# Patient Record
Sex: Female | Born: 2000 | Race: White | Hispanic: No | Marital: Single
Health system: Northeastern US, Academic
[De-identification: ages and names within clinical notes are randomized; demographics above are authoritative.]

## PROBLEM LIST (undated history)

## (undated) DIAGNOSIS — Z87820 Personal history of traumatic brain injury: Secondary | ICD-10-CM

## (undated) DIAGNOSIS — K219 Gastro-esophageal reflux disease without esophagitis: Secondary | ICD-10-CM

## (undated) HISTORY — PX: KNEE ARTHROSCOPY: SHX127

## (undated) HISTORY — DX: Gastro-esophageal reflux disease without esophagitis: K21.9

## (undated) HISTORY — DX: Personal history of traumatic brain injury: Z87.820

---
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
## 2019-01-03 ENCOUNTER — Ambulatory Visit

## 2019-01-21 ENCOUNTER — Ambulatory Visit: Admit: 2019-01-21

## 2019-01-21 ENCOUNTER — Ambulatory Visit: Admitting: Pediatrics

## 2019-01-21 MED ORDER — Apri: Tablet | Freq: Every day | 2 refills | 0 days | Status: AC

## 2019-01-21 MED ORDER — ProAir HFA: 108 | 2 | 0 refills | 0 days | Status: AC

## 2019-01-21 NOTE — Progress Notes (Signed)
 MARCELENE, WEIDEMANN **DOB:** 10-12-2000 (17 yo F) **Acc No.** [id number] **DOS:**  01/21/2019    ---      **Progress Note**    ---    **Patient:** Alison Hodges, Alison Hodges     **Account Number:** [id number] **External MRN:** [id number]  **Provider:** Jeani Hawking, NP     **DOB:** April 28, 2001 **Age:** 18 Y **Sex:** Female  **Date:** 01/21/2019     **Phone:** [phone]  **CHN#:** [id number]     **Address:** [address] [place], [place]     **Pcp:** Stormy Card, MD        * * *        **Subjective:**        ---      **Chief Complaints:**    ------        * 17 YEAR PE    ------    ---      **HPI:**    _Pre-Visit Prep QPC:_ :    2nd private trumenba, 1st hpv, ht, vt, trans hgb, gc    BC (private) not vfc eligible.    _6-11 Years_ :    Concerns: none.    Interim History No significant history.    Development/School graduated, going to [employer], Occupational hygienist major for  journalism, Doing well in school, + friends.    Nutrition Drinks 1% milk, good eater, usually well balanced.    Output No problems voiding/stooling.    Sleep No problems.    Environment No smokers in home, Smoke detector in home, no TB risk, Carbon  Monoxide detector in home, No Firearms in home.    Dental Dental Care in past 6 months, 28 teeth.    Friends several friends.    LMP 1 wk ago    not SA but knows SS    denies substance use.    _Questions for Child_ :    Has had sex education/discussion: yes.    Body development discussion, yet? yes.    Dating/Sex/Sexuality questions? no, discussed.    Do you ever feel down or depressed? no.    Regular exercise yes.    Have you begun having sex? no.    Do you smoke cigarettes, drink alcohol, or use other drugs? No.    _GYN_ :    LMP 1 week ago.    Menses Duration < 7 Days.    Menstrual Frequency 24-30 Days.    Pt does not need GYN exam:    -no hx persistant vag. dc    -not SA + dysuria/urinary tract sx    -no dysmenorrhea that is non responsive to NSAIDs    -no amenorrhea    -no abnormal vag. bleeding    -no lower  abdominal pain     -not going to use diaphram, IUD or implant    -no need for PAP <49 years old; no hx immunosuppression +SA; not SA x 51yrs.    -no concerns re abuse    -not pregnant    NO Contraindications:    No Hx of:    -thromboembolic disorder    -coronary or cerebrovascular disease    -uncontrolled hypertension    -markedly impaired liver fx or hx of hepatoma    -no known or suspected breast ca    -triglycerides <350    -no known or suspected pregnancy    -does not smoke cigarettes    -no migraine w/ aura.    ------      **ROS:**    _PED Behavioral/Developmental_ :       *  General Behavioral no concerns.     ------    _PED HEENT/Resp_ :      * Allergies/ Reactions No.     ------        * Dental: Yes.     ------    _PED GI/Nutrition_ :      * Abdominal pain no.     ------        * Constipation No.     ------        * Diarrhea No.     ------    _PED Musculoskeletal/Neuro_ :      * Musculoskeletal pain No.     ------    _ADO OBGYN History_ :      * 1st day of LMP: 1 week ago.     ------        * Cycle: regular.     ------        * Flow: varies.     ------        * Duration: 4-7.     ------        * Dysmenorrhea? no.     ------    _ADO Anticipatory Guidance_ :      * Self-exam: Self breast exam, Taught.     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 very mild intermittent. not recquirg Albuterol , 01/2016    ------        * R radial buckle Fx--12/26/05, RESOLVED        * vasovagal reaction with pain - syncope, urinary incontinence - 12/08, refer to Dr. Durward Mallard 12/10/09, RESOLVED        * went to [facility] for syncopy, 06/16/10. EKG, Lt axis deviation. Needs F up with cardiology per ED MD. DW Cardiology MD at [facility] by ED Md. 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 still ongoing lt knee discomfort, Re refer to ORTHO , 01/2016, on going 02/2017        * Father declined HPV vaccine 01/2016, 02/2017. declined meningitis vaccine 02/2017        * refer to counselling for Anxiety 02/2017        * Vasovagal episode        * Vasovagal episode         ------      **Surgical History:**    ------        * none     ------    ---     **Hospitalization/Major Diagnostic Procedure:**    ------        * none     ------    ---     **Family History:**    ------     Mother: alive, healthy    ------     Father: alive, healthy    ------    ---      **Social History:**    ------     Lives w/ parents.    ------    ---      **Medications:**    ------    Not-Taking/PRN     * Claritin 10 mg tablet 1 tab(s) orally  once a day    ------        * Flonase 50 mcg/inh spray 1 spray(s) intranasally once a day, Notes: *please review for potential _update for e-prescription and drug interaction check*    ------        * ProAir HFA 90 mcg/inh aerosol 2 puff(s) inhaled 4 times a day, Notes: *please review for potential _update for e-prescription and drug interaction check*    ------        * Vitamin B2 25 mg tablet 1 tab(s) orally once a day, Notes: *please review for potential _update for e-prescription and drug interaction check*    ------    Discontinued     * Albuterol 2.5 mg/3 mL (0.083%) solution 1 vial inhaled 4-6hrs prn cough/wheeze, Notes: *please review for potential _update for e-prescription and drug interaction check*    ------        * Medication List reviewed and reconciled with the patient    ------    ---      **Allergies:**    ------        * N.K.D.A.    ------    ---         **Objective:**        ---      **Vitals:** Ht-in 62.75, Wt-lbs 120.5, BMI 21.51, BP 110/70, Temp 97.6 ta,  BSA 1.55, Wt-kg 54.66, Wt %ile 39.75, Wt Change 12 lb, BMI Percentile 50.64.    ------      **Examination:    ** _Pediatric Exam:_      * GENERAL APPEARANCE:   well nourished, alert,active.     ------        * SKIN:  normal.     ------        * EYES: red reflex +, PERRLA, fundi normal, EOMI.     ------        * EARS: bilateral TM normal color, canals normal.     ------        * NOSE: nares patent and clear, mucosa normal.     ------        * ORAL CAVITY: moist mucus membranes, no lesions.     ------        * NECK: supple,no lymphadenopathy,thyroid normal.     ------        * HEART: regular rhythm, no murmurs.     ------        * LUNGS:  clear, equal breath sounds bilaterally.     ------        * ABDOMEN:  soft,nontender, no masses, normal bowel sounds.     ------        * GENITALIA: normal external genitalia, Tanner stage 5, normal breasts.     ------        * EXTREMITIES/BACK: good range of motion, no scoliosis.     ------        * NEUROLOGIC EXAM:  normal tone and motor development, normal sensory system and reflexes, normal cranial nerves II-XII.     ------        * DEVELOPMENTAL SCREEN PSC-Y - no concerns.     ------            **Assessment:**        ---      **Assessment:**          * Encounter for general adult medical examination without abnormal findings - Z00.00 (Primary)     ------        *  Encounter for immunization - Z23     ------        * Encounter for screening for infections with a predominantly sexual mode of transmission - Z11.3     ------        * Oral contraception initial prescription - Z30.011     ------        * Mild intermittent asthma without complication - J45.20     ------        **Plan:**        ---       **1\. Encounter for general adult medical examination without abnormal  findings**       * **Lab:** HEMOGLOBIN  14.2 g/dl   Value Reference Range    ---------    HEMOGLOBIN 14.2 g/dl     *  **Lab:** HCG, QL, URINE  negative  Gillis,Kathleen 01/21/2019 3:00:50 PM >IQC SEEN    ------     **2\. Encounter for immunization**       * **Lab:** CHLAMYDIA/N. GONORRHOEAE RNA, TMA    ------        *  **Procedure:** AUDIOMETRY-SCREEN  passed 25 all frequencies both ears   Nelson,Naomi , RN 01/21/2019 3:03:58 PM  >    ------    *  **Procedure:** VISUAL ACUITY SCREEN  20/20 far and near, left and right eyes  Nelson,Naomi , RN 01/21/2019 3:04:47  PM >    ------     **3\. Oral contraception initial prescription**    Start Apri Tablet, 0.15-30 MG-MCG, 1 tablet, Orally, Once a day, 84 day(s), 84  Tablet, Refills 2 ;    Start ProAir HFA Aerosol Solution, 108 (90 Base) MCG/ACT, 2 puff as needed,  inhalation, every 4-6 hrs PRN, 2, Refills 0 .    Notes: Notes: advised on nutrition and good eating habits.    routine well care. anticipatory guidance as below.    counseling: break through bleeding is common in the first few months, should  resolve w/ consistant use. If nauseous, take OCP w/ food or before bed. Take  at approximately the same time daily. Weight gain is NOT a common side effect.  **f/u w/ s/s thromboembolic disease---severe pain in leg or abdomen; cough,  chest pain, shortness of breath.    information/instructions on oral contraceptives provided, pt reviewed & signed  consent form. 25 min spent w/ patient, >50% counseling.    **4\. Mild intermittent asthma without complication**    Notes: rare albuterol use, f/u prn s/s.     **Immunizations:**       * HPV Vaccine : 0.5 (Route: Intramuscular) given by Bonna Gains on Right Arm    ------        * Meningococcal Ferne Reus) (Route: Intramuscular) given by Wynona Dove on Left Arm    ------      **Labs:**    ------         * **Lab:**  HCG    ---      **Procedure Codes:**    ------        * [address] BRIEF EMOTIONAL/BEHAV ASSMT, Modifiers: U1     ------        * 60454 URINE PREGNANCY TEST    ------        * 90460 IMADM ANY ROUTE 1ST VAC/TOX, Units: 2.00     ------        * 09811 HPV Vaccine    ------        * 91478 MENB RP  W/OMV VACCINE IM    ------        * 92551 AUDIOMETRY-SCREEN    ------        * 19379 VISUAL ACUITY SCREEN    ------     ---      **Preventive:**    Teen:    Alcohol .    Breast Exam .    Development .    Drugs .    Eating/Body Image .    Emotions .    Exercise .    Hygiene .    Injury Prevention .    Nutrition/Weight .    Physical Activity .    Safe Sex .    Seat Belt use .    STD .    Tobacco Avoidance .    ------     **Follow Up:** well child in 1 yr. 3 Months    ------    ---    ---        ---    Electronically signed by Jeani Hawking on 01/21/2019 at 06:27 PM EDT    Sign off status: Completed          * * *      **Provider:** Jeani Hawking, NP  **Date:** 01/21/2019    ------

## 2019-01-21 NOTE — Progress Notes (Signed)
 .  Progress Notes  .  Patient: Alison Hodges  Provider: Jeani Hawking    .  DOB: 01-20-2001 Age: 18 Y Sex: Female  .  PCP: Stormy Card MD  Date: 01/21/2019  .  --------------------------------------------------------------------------------  .  REASON FOR APPOINTMENT  .  1. 17 YEAR PE  .  HISTORY OF PRESENT ILLNESS  .  Pre-Visit Prep QPC::  2nd private trumenba, 1st  hpv, ht, vt, trans hgb, gcBC (private) not vfc eligible.  Marland Kitchen  6-11 Years:  Concerns:  none.  .  Interim History  No significant history.  .  Development/School  graduated, going to [employer], Occupational hygienist major for  journalism, Doing well in school, + friends.  .  Nutrition  Drinks 1% milk, good eater, usually well balanced.  .  Output  No problems voiding/stooling.  .  Sleep  No problems.  .  Environment  No smokers in home, Smoke detector in home, no TB risk, Carbon  Monoxide detector in home, No Firearms in home.  .  Dental  Dental Care in past 6 months, 28 teeth.  .  Friends  several friends.  Marland Kitchen   LMP 1 wk agonot SA but knows SSdenies substance use.  .  Questions for Child:  Has had sex education/discussion:  yes.  .  Body development discussion, yet?  yes.  .  Dating/Sex/Sexuality questions?  no, discussed.  .  Do you ever feel down or depressed?  no.  .  Regular exercise  yes.  .  Have you begun having sex?  no.  .  Do you smoke cigarettes, drink alcohol, or use  other drugs?  No.  .  GYN:  LMP  1 week ago.  .  Menses Duration  < 7 Days.  .  Menstrual Frequency  24-30 Days.  .   Pt does not need GYN exam:-no hx persistant vag. dc-not SA +  dysuria/urinary tract sx-no dysmenorrhea that is non responsive  to NSAIDs-no amenorrhea-no abnormal vag. bleeding-no lower  abdominal pain -not going to use diaphram, IUD or implant-no need  for PAP <23 years old; no hx immunosuppression +SA; not SA x  6yrs.-no concerns re abuse-not pregnantNO Contraindications:No Hx  of:-thromboembolic disorder-coronary or cerebrovascular  disease-uncontrolled  hypertension-markedly impaired liver fx or  hx of hepatoma-no known or suspected breast ca-triglycerides  <350-no known or suspected pregnancy-does not smoke cigarettes-no  migraine w/ aura.  .  CURRENT MEDICATIONS  .  Not-Taking/PRN Claritin 10 mg tablet 1 tab(s) orally once a day  Not-Taking/PRN Flonase 50 mcg/inh spray 1 spray(s) intranasally  once a day, Notes: *please review for potential _update for  e-prescription and drug interaction check*  Not-Taking/PRN ProAir HFA 90 mcg/inh aerosol 2 puff(s) inhaled 4  times a day, Notes: *please review for potential _update for  e-prescription and drug interaction check*  Not-Taking/PRN Vitamin B2 25 mg tablet 1 tab(s) orally once a  day, Notes: *please review for potential _update for  e-prescription and drug interaction check*  Discontinued Albuterol 2.5 mg/3 mL (0.083%) solution 1 vial  inhaled 4-6hrs prn cough/wheeze, Notes: *please review for  potential _update for e-prescription and drug interaction check*  Medication List reviewed and reconciled with the patient  .  PAST MEDICAL HISTORY  .  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 01/30/11 no albuterol in >42yr.only EIA.  very mild intermittent. not recquirg Albuterol , 01/2016  R radial buckle Fx--12/26/05, RESOLVED  vasovagal reaction with pain - syncope, urinary incontinence -  12/08, refer to Dr. Durward Mallard 12/10/09,  RESOLVED  went to [facility] for [facility]copy, 06/16/10. EKG, Lt axis deviation. Needs  F up with cardiology per ED MD. DW Cardiology MD at [facility] by ED Md.  seen By cardiology, normal vaso vagal, No f up, Neurology  wnl.RESOLVED 01/2016  s shaped scoliosis - 10', RESOLVED 01/2016, 02/2017  Knee pain - 06/30/13 - osgood schlatter & mild chondromalacia.  still ongoing lt knee discomfort, Re refer to ORTHO , 01/2016, on  going 02/2017  Father declined HPV vaccine 01/2016, 02/2017. declined meningitis  vaccine 02/2017  refer to counselling for Anxiety 02/2017  Vasovagal episode  Vasovagal episode  .  ALLERGIES  .  N.K.D.A.  .  SURGICAL  HISTORY  .  none  .  FAMILY HISTORY  .  Mother: alive, healthy  Father: alive, healthy  .  SOCIAL HISTORY  .  Lives w/ parents.  .  HOSPITALIZATION/MAJOR DIAGNOSTIC PROCEDURE  .  none  .  REVIEW OF SYSTEMS  .  PED Behavioral/Developmental:  .  General Behavioral    no concerns .  Marland Kitchen  PED HEENT/Resp:  .  Allergies/ Reactions No. Dental: Yes.  Marland Kitchen  PED GI/Nutrition:  .  Abdominal pain    no . Constipation No. Diarrhea No.  .  PED Musculoskeletal/Neuro:  Marland Kitchen  Musculoskeletal pain No.  .  ADO OBGYN History:  .  1st day of LMP:    1 week ago . Cycle:    regular . Flow:     varies . Duration:    4-7 . Dysmenorrhea?    no .  .  ADO Anticipatory Guidance:  .  Self-exam:    Self breast exam, Taught .  Marland Kitchen  VITAL SIGNS  .  Ht-in 62.75, Wt-lbs 120.5, BMI 21.51, BP 110/70, Temp 97.6 ta,  BSA 1.55, Wt-kg 54.66, Wt %ile 39.75, Wt Change 12 lb, BMI  Percentile 50.64.  Marland Kitchen  EXAMINATION  .  Pediatric Exam:  GENERAL APPEARANCE: well nourished, alert,active.  Marland Kitchen  SKIN: normal.  .  EYES:red reflex +, PERRLA, fundi normal, EOMI.  Marland Kitchen  EARS:bilateral TM normal color, canals normal.  .  NOSE:nares patent and clear, mucosa normal.  .  ORAL CAVITY:moist mucus membranes, no lesions.  Marland Kitchen  NECK:supple,no lymphadenopathy,thyroid normal.  .  HEART:regular rhythm, no murmurs.  Marland Kitchen  LUNGS: clear, equal breath sounds bilaterally.  .  ABDOMEN: soft,nontender, no masses, normal bowel sounds.  .  GENITALIA:normal external genitalia, Tanner stage 5, normal  breasts.  Marland Kitchen  EXTREMITIES/BACK:good range of motion, no scoliosis.  Marland Kitchen  NEUROLOGIC EXAM: normal tone and motor development, normal  sensory system and reflexes, normal cranial nerves II-XII.  Marland Kitchen  DEVELOPMENTAL SCREENPSC-Y - no concerns.  .  ASSESSMENTS  .  Encounter for general adult medical examination without abnormal  findings - Z00.00 (Primary)  .  Encounter for immunization - Z23  .  Encounter for screening for infections with a predominantly  sexual mode of transmission - Z11.3  .  Oral contraception initial  prescription - Z30.011  .  Mild intermittent asthma without complication - J45.20  .  TREATMENT  .  Encounter for general adult medical examination  without abnormal findings  LAB: HEMOGLOBIN  14.2 g/dl HEMOGLOBIN     96.0 g/dl     ()  .  Marland Kitchen  LAB: HCG, QL, URINE  negative    Gillis,Kathleen 01/21/2019 3:00:50 PM >IQC SEEN  .  Marland Kitchen  Encounter for immunization  LAB: CHLAMYDIA/N. GONORRHOEAE RNA, TMA  .  AUDIOMETRY-SCREENpassed 25 all  frequencies both earsNelson,Naomi  , RN 01/21/2019 3:03:58 PM >  VISUAL ACUITY SCREEN20/20 far and near, left and right  eyesNelson,Naomi , RN 01/21/2019 3:04:47 PM >  .  .  Oral contraception initial prescription  Start Apri Tablet, 0.15-30 MG-MCG, 1 tablet, Orally, Once a day,  84 day(s), 84 Tablet, Refills 2  Start ProAir HFA Aerosol Solution, 108 (90 Base) MCG/ACT, 2 puff  as needed, inhalation, every 4-6 hrs PRN, 2, Refills 0  Notes: Notes: advised on nutrition and good eating habits.  routine well care. anticipatory guidance as below.  .  counseling: break through bleeding is common in the first few  months, should resolve w/ consistant use. If nauseous, take OCP  w/ food or before bed. Take at approximately the same time daily.  Weight gain is NOT a common side effect. **f/u w/ s/s  thromboembolic disease---severe pain in leg or abdomen; cough,  chest pain, shortness of breath.  .  information/instructions on oral contraceptives provided, pt  reviewed & signed consent form. 25 min spent w/ patient, >50%  counseling.  .  .  Mild intermittent asthma without complication  Notes: rare albuterol use, f/u prn s/s.  Marland Kitchen  LABS  .   LAB: HCG  .  Marland Kitchen  PREVENTIVE MEDICINE  .  Teen:  .  Alcohol   .  Breast Exam   .  Development   .  Drugs   .  Eating/Body Image   .  Emotions   .  Exercise   .  Hygiene   .  Injury Prevention   .  Nutrition/Weight   .  Physical Activity   .  Safe Sex   .  Seat Belt use   .  STD   .  Tobacco Avoidance   .  Marland Kitchen  PROCEDURE CODES  .  29562 BRIEF EMOTIONAL/BEHAV ASSMT, Modifiers:  U1  .  [id number] URINE PREGNANCY TEST  .  90460 IMADM ANY ROUTE 1ST VAC/TOX, Units: 2.00  .  13086 HPV Vaccine  .  57846 MENB RP W/OMV VACCINE IM  .  92551 AUDIOMETRY-SCREEN  .  96295 VISUAL ACUITY SCREEN  .  FOLLOW UP  .  well child in 1 yr. 3 Months  .  Electronically signed by Jeani Hawking on 01/21/2019  at 06:27 PM EDT  .  Document electronically signed by Jeani Hawking    .

## 2019-01-23 LAB — HX CHLAMYDIA/N. GONORRHOEAE RNA, TMA
HX CHLAMYDIA TRACHOMATIS: NOT DETECTED
HX NEISSERIA GONORRHOEAE: NOT DETECTED

## 2019-03-28 DIAGNOSIS — S90929A Unspecified superficial injury of unspecified foot, initial encounter: Secondary | ICD-10-CM | POA: Insufficient documentation

## 2019-06-11 ENCOUNTER — Ambulatory Visit: Admit: 2019-06-11

## 2019-06-11 ENCOUNTER — Ambulatory Visit: Admitting: Pediatrics

## 2019-06-11 NOTE — Progress Notes (Signed)
.    Progress Notes  .  Patient: Alison Hodges  Provider: Darrall Dears    .  DOB: [birth year] Age: 18 Y Sex: Female  .  PCP: Stormy Card MD  Date: 06/11/2019  .  --------------------------------------------------------------------------------  .  REASON FOR APPOINTMENT  .  1. FLU SHOT  .  HISTORY OF PRESENT ILLNESS  .  Flu Shot:  Informant: Patient with Mother, denies  exposure, symptoms or recent testing for Covid. Is requesting 2nd  HPV vaccine today. kgma.  Flu Shot  Patient is here for flu vaccine. No current illness or high  fever.  .  CURRENT MEDICATIONS  .  Taking Apri 0.15-30 MG-MCG Tablet 1 tablet Orally Once a day  Taking ProAir HFA 108 (90 Base) MCG/ACT Aerosol Solution 2 puff  as needed inhalation every 4-6 hrs PRN  Not-Taking/PRN Claritin 10 mg tablet 1 tab(s) orally once a day  Not-Taking/PRN Flonase 50 mcg/inh spray 1 spray(s) intranasally  once a day, Notes: *please review for potential _update for  e-prescription and drug interaction check*  Not-Taking/PRN ProAir HFA 90 mcg/inh aerosol 2 puff(s) inhaled 4  times a day, Notes: *please review for potential _update for  e-prescription and drug interaction check*  Not-Taking/PRN Vitamin B2 25 mg tablet 1 tab(s) orally once a  day, Notes: *please review for potential _update for  e-prescription and drug interaction check*  .  ALLERGIES  .  yes[Allergies Verified]  .  REVIEW OF SYSTEMS  .  PED HEENT/Resp:  .  Respiratory:    denies congestion .  Marland Kitchen  ASSESSMENTS  .  Encounter for immunization - Z23 (Primary)  .  TREATMENT  .  Encounter for immunization  Notes: Immunization given, VIS given. No immediate adverse  reaction.  Marland Kitchen  PROCEDURE CODES  .  84696 9vhpv Vacc 2/3 Dose Sched Im Use  .  90686 FLU VAC NO PRSV 4 VAL 3 YRS+  .  FOLLOW UP  .  prn  .  Electronically signed by Darrall Dears on  06/12/2019 at 03:17 PM EST  .  Document electronically signed by Darrall Dears    .

## 2019-06-11 NOTE — Progress Notes (Signed)
 ZANI, KYLLONEN **DOB:** 2000/11/28 (18 yo F) **Acc No.** [id number] **DOS:**  06/11/2019    ---      **Progress Notes**    ---    **Patient:** Alison Hodges, Alison Hodges     **Account Number:** [id number] **External MRN:** [id number]  **Provider:** Darrall Dears, NP     **DOB:** [birth year] **Age:** 18 Y **Sex:** Female  **Date:** 06/11/2019     **Phone:** [phone]  **CHN#:** [id number]     **Address:** [address] [place], [place]     **Pcp:** Stormy Card, MD        * * *        **Subjective:**        ---      **Chief Complaints:**    ------        * FLU SHOT    ------    ---      **HPI:**    _Flu Shot_ :    Informant: Patient with Mother, denies exposure, symptoms or recent testing  for Covid. Is requesting 2nd HPV vaccine today. kgma.    Flu Shot Patient is here for flu vaccine. No current illness or high fever.    ------     **ROS:**    _PED HEENT/Resp_ :       * Respiratory: denies congestion.     ------     **Medications:**    ------    Taking     * Apri 0.15-30 MG-MCG Tablet 1 tablet Orally Once a day    ------        * ProAir HFA 108 (90 Base) MCG/ACT Aerosol Solution 2 puff as needed inhalation every 4-6 hrs PRN    ------    Not-Taking/PRN     * Claritin 10 mg tablet 1 tab(s) orally once a day    ------        * Flonase 50 mcg/inh spray 1 spray(s) intranasally once a day, Notes: *please review for potential _update for e-prescription and drug interaction check*    ------        * ProAir HFA 90 mcg/inh aerosol 2 puff(s) inhaled 4 times a day, Notes: *please review for potential _update for e-prescription and drug interaction check*    ------        * Vitamin B2 25 mg tablet 1 tab(s) orally once a day, Notes: *please review for potential _update for e-prescription and drug interaction check*    ------    ---      **Allergies:**    ------    ---        **Objective:**        ---         **Assessment:**        ---      **Assessment:**          * Encounter for immunization - Z23 (Primary)      ------        **Plan:**        ---       **1\. Encounter for immunization**    Notes:  Immunization given, VIS given. No immediate adverse reaction.    ---     **Immunizations:**       * Flu Vaccine : 0.5 mL (Route: Intradermal) given by Sande Rives on Left Arm (Encounter for immunization)    ------        * HPV Vaccine (HPV9) : 0.5 mL (Route: Intramuscular) given by Sande Rives on Right Arm (Encounter for immunization)    ------      **  Procedure Codes:**    ------        * 99242 9vhpv Vacc 2/3 Dose Sched Im Use    ------        * 68341 FLU VAC NO PRSV 4 VAL 3 YRS+    ------    ---      **Follow Up:** prn    ------    ---    ---        ---    Electronically signed by Darrall Dears on 06/12/2019 at 03:17 PM EST    Sign off status: Completed          * * *      **Provider:** Darrall Dears, NP  **Date:** 06/11/2019    ------

## 2019-06-13 ENCOUNTER — Ambulatory Visit: Admit: 2019-06-13

## 2019-06-13 ENCOUNTER — Ambulatory Visit: Admitting: Pediatrics

## 2019-06-13 MED ORDER — Aviane: 28 | Freq: Every day | 5 refills | 0 days | Status: AC

## 2019-06-13 NOTE — Progress Notes (Signed)
 Alison Hodges, Alison Hodges **DOB:** 20-Jul-2000 (18 yo F) **Acc No.** [id number] **DOS:**  06/13/2019    ---      **Progress Notes**    ---    **Patient:** Alison Hodges, Alison Hodges     **Account Number:** [id number] **External MRN:** [id number]  **Provider:** Darrall Dears, NP     **DOB:** [birth year] **Age:** 18 Y **Sex:** Female  **Date:** 06/13/2019     **Phone:** [phone]  **CHN#:** [id number]     **Address:** [address] [place], [place]     **Pcp:** Stormy Card, MD        * * *        **Subjective:**        ---      **Chief Complaints:**    ------        * BIRTH CONTROL    ------    ---      **HPI:**    _GENERAL_ :    This is a TeleHealth visit.The patient and guardian understand that by  participating in this Telehealth visit they give consent to its use. The  Telehealth used included video and audio. The Telehealth system is being used  due to the current Covid-19 pandemic and the federally declared state of  public health emergency. The patient and guardian are at the above home  address. The provider (me) is at [address], [place], [place] [address].  Participant are: Olinda and myself.    HPI 18 year old female accompanied by self. presents for virtual telehealth  visit to discuss OCP. patient currently on Apri 0.15-30 MG-MCG. patient feels  she is gaining weight on current OCP and would like to discuss changing to  another OCP. patient denies other side effects/adverse reactions. denies BTB.  patient is not currently sexually active, reports began taking to be proactive  upon starting college. 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 very mild intermittent. not recquirg Albuterol , 01/2016    ------        * R radial buckle Fx--12/26/05, RESOLVED        * vasovagal reaction with pain - syncope, urinary incontinence - 12/08, refer to Dr. Durward Mallard 12/10/09, RESOLVED        * went to [facility] for [facility]copy, 06/16/10. EKG, Lt axis deviation. Needs F up  with cardiology per ED MD. DW Cardiology MD at [facility] by ED Md. 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 still ongoing lt knee discomfort, Re refer to ORTHO , 01/2016, on going 02/2017        * Father declined HPV vaccine 01/2016, 02/2017. declined meningitis vaccine 02/2017        * refer to counselling for Anxiety 02/2017        * Vasovagal episode        * Vasovagal episode         ------      **Medications:**    ------    Taking     * Apri 0.15-30 MG-MCG Tablet 1 tablet Orally Once a day    ------    Not-Taking/PRN     * Claritin 10 mg tablet 1 tab(s) orally  once a day    ------        * Flonase 50 mcg/inh spray 1 spray(s) intranasally once a day, Notes: *please review for potential _update for e-prescription and drug interaction check*    ------        * ProAir HFA 108 (90 Base) MCG/ACT Aerosol Solution 2 puff as needed inhalation every 4-6 hrs PRN    ------        * ProAir HFA 90 mcg/inh aerosol 2 puff(s) inhaled 4 times a day, Notes: *please review for potential _update for e-prescription and drug interaction check*    ------        * Vitamin B2 25 mg tablet 1 tab(s) orally once a day, Notes: *please review for potential _update for e-prescription and drug interaction check*    ------        * Medication List reviewed and reconciled with the patient    ------    ---      **Allergies:**    ------        * N.K.D.A.    ------    ---         **Objective:**        ---      **Examination:    ** _GENERAL:_      * General  Alert, appears well nourished and hydrated, in NAD.     ------        * Eyes  By telehealth camera, facies appears normal, eyes show no redness nor discharge and have normal extraocular movements.     ------        * Ears, Nose, Mouth and Throat By telehealth camera, nose appears not congested nor rhinorrhea,  pharynx appears pink without erythema or exudate, tonsils apparent, mucous membranes appear moist.     ------        * Neck  By telehealth camera, neck shows no obvious lymphadenopathy nor thyromegaly.     ------        * Respiratory  By telehealth camera, chest appears normal shape and expansion, no chest wall retractions visualized.     ------        * Abdomen  By telehealth camera, abdomen appears flat, non distended with no obvious rigidity.     ------        * Musculoskeletal / Neurological  By telehealth camera, patient with full, equal ROM of extremities; no obvious neurological impairments, patient responds normally to time and space.     ------        * Skin  By telehealth camera, skin appears healthy with no rashes.     ------            **Assessment:**        ---      **Assessment:**          * Dysmenorrhea - N94.6 (Primary)     ------        * Oral contraceptive intolerance - Z78.9     ------        * Long term current use of hormonal contraceptive - Z79.3     ------        **Plan:**        ---       **1\. Dysmenorrhea**    Stop Apri Tablet, 0.15-30 MG-MCG, 1 tablet, Orally, Once a day ;    Start Aviane Tablet, 0.1-20 MG-MCG, 1 tablet, Orally, Once a day, 28 day(s),  28, Refills  5 .    Notes: Will start patient on lower estrogen OCP Aviane 0.1-20 MG-MCG. Patient  may complete current pack of Apri and start Aviane at next cycle. Reviewed the  need the take at the same time every day as those with lower estrogen have  smaller window. Reinforced safe sex practices and 100% condom use for  protection against STDs/STIs and pregnancy. Monitor for side effects including  BTB, headaches, nausea (or other) and follow up as needed. All questions  answered. Patient understands and will monitor. Recheck in 6 months.    ---    **2\. Others**    Notes: Length of visit 12 minutes.     **Follow Up:** 6 months (Reason: GYFU/ocp check)    ------    ---    ---        ---    Electronically signed by  Darrall Dears on 06/28/2019 at 02:56 PM EST    Sign off status: Completed          * * *      **Provider:** Darrall Dears, NP  **Date:** 06/13/2019    ------

## 2019-06-13 NOTE — Progress Notes (Signed)
 .  Progress Notes  .  Patient: Alison Hodges  Provider: Darrall Dears    .  DOB: 2001-04-27 Age: 18 Y Sex: Female  .  PCP: Stormy Card MD  Date: 06/13/2019  .  --------------------------------------------------------------------------------  .  REASON FOR APPOINTMENT  .  1. BIRTH CONTROL  .  HISTORY OF PRESENT ILLNESS  .  GENERAL:  This is a TeleHealth visit.The patient  and guardian understand that by participating in this Telehealth  visit they give consent to its use. The Telehealth used included  video and audio. The Telehealth system is being used due to the  current Covid-19 pandemic and the federally declared state of  public health emergency. The patient and guardian are at the  above home address. The provider (me) is at [address],  [place], [place] [address]. Participant are: Dennis and myself.  HPI  18 year old female accompanied by self. presents for virtual  telehealth visit to discuss OCP. patient currently on Apri  0.15-30 MG-MCG. patient feels she is gaining weight on current  OCP and would like to discuss changing to another OCP. patient  denies other side effects/adverse reactions. denies BTB. patient  is not currently sexually active, reports began taking to be  proactive upon starting college. .  .  CURRENT MEDICATIONS  .  Taking Apri 0.15-30 MG-MCG Tablet 1 tablet Orally Once a day  Not-Taking/PRN Claritin 10 mg tablet 1 tab(s) orally once a day  Not-Taking/PRN Flonase 50 mcg/inh spray 1 spray(s) intranasally  once a day, Notes: *please review for potential _update for  e-prescription and drug interaction check*  Not-Taking/PRN ProAir HFA 108 (90 Base) MCG/ACT Aerosol Solution  2 puff as needed inhalation every 4-6 hrs PRN  Not-Taking/PRN ProAir HFA 90 mcg/inh aerosol 2 puff(s) inhaled 4  times a day, Notes: *please review for potential _update for  e-prescription and drug interaction check*  Not-Taking/PRN Vitamin B2 25 mg tablet 1 tab(s) orally once a  day, Notes: *please review for  potential _update for  e-prescription and drug interaction check*  Medication List reviewed and reconciled with the patient  .  PAST MEDICAL HISTORY  .  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 01/30/11 no albuterol in >40yr.only EIA.  very mild intermittent. not recquirg Albuterol , 01/2016  R radial buckle Fx--12/26/05, RESOLVED  vasovagal reaction with pain - syncope, urinary incontinence -  12/08, refer to Dr. Durward Mallard 12/10/09, RESOLVED  went to [facility] for [facility]copy, 06/16/10. EKG, Lt axis deviation. Needs  F up with cardiology per ED MD. DW Cardiology MD at [facility] by ED Md.  seen By cardiology, normal vaso vagal, No f up, Neurology  wnl.RESOLVED 01/2016  s shaped scoliosis - 10', RESOLVED 01/2016, 02/2017  Knee pain - 06/30/13 - osgood schlatter & mild chondromalacia.  still ongoing lt knee discomfort, Re refer to ORTHO , 01/2016, on  going 02/2017  Father declined HPV vaccine 01/2016, 02/2017. declined meningitis  vaccine 02/2017  refer to counselling for Anxiety 02/2017  Vasovagal episode  Vasovagal episode  .  ALLERGIES  .  N.K.D.A.  Marland Kitchen  EXAMINATION  .  GENERAL:  General Alert, appears well nourished and hydrated, in NAD.  Marland Kitchen  Eyes By telehealth camera, facies appears normal, eyes show no  redness nor discharge and have normal extraocular movements.  .  Ears, Nose, Mouth and ThroatBy telehealth camera, nose appears  not congested nor rhinorrhea, pharynx appears pink without  erythema or exudate, tonsils apparent, mucous membranes appear  moist.  .  Neck  By telehealth camera, neck shows no obvious lymphadenopathy  nor thyromegaly.  Marland Kitchen  Respiratory By telehealth camera, chest appears normal shape and  expansion, no chest wall retractions visualized.  .  Abdomen By telehealth camera, abdomen appears flat, non distended  with no obvious rigidity.  .  Musculoskeletal / Neurological By telehealth camera, patient with  full, equal ROM of extremities; no obvious neurological  impairments, patient responds normally to time and space.  .  Skin By telehealth  camera, skin appears healthy with no rashes.  .  ASSESSMENTS  .  Dysmenorrhea - N94.6 (Primary)  .  Oral contraceptive intolerance - Z78.9  .  Long term current use of hormonal contraceptive - Z79.3  .  TREATMENT  .  Dysmenorrhea  Stop Apri Tablet, 0.15-30 MG-MCG, 1 tablet, Orally, Once a day  Start Aviane Tablet, 0.1-20 MG-MCG, 1 tablet, Orally, Once a day,  28 day(s), 28, Refills 5  Notes: Will start patient on lower estrogen OCP Aviane 0.1-20  MG-MCG. Patient may complete current pack of Apri and start  Aviane at next cycle. Reviewed the need the take at the same time  every day as those with lower estrogen have smaller window.  Reinforced safe sex practices and 100% condom use for protection  against STDs/STIs and pregnancy. Monitor for side effects  including BTB, headaches, nausea (or other) and follow up as  needed. All questions answered. Patient understands and will  monitor. Recheck in 6 months.  .  .  Others  Notes: Length of visit 12 minutes.  .  FOLLOW UP  .  6 months (Reason: GYFU/ocp check)  .  Electronically signed by Darrall Dears on  06/28/2019 at 02:56 PM EST  .  Document electronically signed by Darrall Dears    .

## 2019-07-22 DIAGNOSIS — M25562 Pain in left knee: Secondary | ICD-10-CM | POA: Insufficient documentation

## 2019-08-29 ENCOUNTER — Other Ambulatory Visit: Payer: Self-pay | Admitting: Sports Medicine

## 2019-08-29 DIAGNOSIS — M25562 Pain in left knee: Secondary | ICD-10-CM

## 2019-08-29 DIAGNOSIS — M2392 Unspecified internal derangement of left knee: Secondary | ICD-10-CM

## 2019-09-09 ENCOUNTER — Other Ambulatory Visit: Payer: Self-pay

## 2019-09-09 ENCOUNTER — Ambulatory Visit
Admission: RE | Admit: 2019-09-09 | Discharge: 2019-09-09 | Disposition: A | Discharge status: Home / Self Care | Payer: BC Managed Care – PPO | Source: Ambulatory Visit | Attending: Sports Medicine | Admitting: Sports Medicine

## 2019-09-09 DIAGNOSIS — M25562 Pain in left knee: Secondary | ICD-10-CM | POA: Diagnosis present

## 2019-09-09 DIAGNOSIS — M2392 Unspecified internal derangement of left knee: Secondary | ICD-10-CM | POA: Insufficient documentation

## 2019-11-11 ENCOUNTER — Ambulatory Visit: Admitting: Pediatrics

## 2019-11-11 NOTE — Progress Notes (Signed)
* * *      HILDAGARD, SOBECKI **DOB:** Dec 21, 2000 (19 yo F) **Acc No.** [id number] **DOS:**  11/11/2019    ---       Page Spiro**    ------    42 Y old Female, DOB: November 25, 2000    [address] [place], [place] [address]    Home: [phone]    Provider: Darrall Dears        * * *    Telephone Encounter    ---    Answered by  Jonette Mate Date: 11/11/2019       Time: 02:55 PM    Message                     Pt called. She is leaving college in [place] this weekend and will need refill of BCP before she back home in [place]. She needs start BCP again sunday , Pt aware she need BCP recheck and another HPV vaccine. She scheduled appt for these.  Script for BCP for 1 month [facility]t to [facility] [place] te [phone], [address]          ------            Action Taken                     [place] , RN 11/11/2019 3:01:26 PM >                 Refills Refill Aviane Tablet, 0.1-20 MG-MCG, Orally, 28 Tablet, 1 tablet,  Once a day, 28 days, Refills=0    ------          * * *                ---          * * *         Provider: Darrall Dears 11/11/2019    ---    Note generated by eClinicalWorks EMR/PM Software ([email or web address])

## 2019-12-02 ENCOUNTER — Ambulatory Visit: Admit: 2019-12-02

## 2019-12-02 ENCOUNTER — Ambulatory Visit: Admitting: Student in an Organized Health Care Education/Training Program

## 2019-12-02 ENCOUNTER — Ambulatory Visit

## 2019-12-02 MED ORDER — Aviane: Tablet | Freq: Every day | 3 refills | 0 days | Status: AC

## 2019-12-02 NOTE — Progress Notes (Signed)
 Alison Hodges, Alison Hodges **DOB:** 11/30/2000 (19 yo F) **Acc No.** [id number] **DOS:**  12/02/2019    ---      **Progress Notes**    ---    **Patient:** Alison Hodges, Alison Hodges     **Account Number:** [id number] **External MRN:** [id number]  **Provider:** Carmelina Paddock, MD     **DOB:** Dec 02, 2000 **Age:** 19 Y **Sex:** Female  **Date:** 12/02/2019     **Phone:** [phone]  **CHN#:** [id number]     **Address:** [address], [place], [id number]     **Pcp:** Stormy Card, MD        * * *        **Subjective:**        ---      **Chief Complaints:**    ------        * OCP, HPV3, JAW PAIN    ------    ---      **HPI:**    _GENERAL_ :    private HPV    Per Mother, patient with fever 101 x 2 days. OTC Tylenol, ld last night.    Vomitting x 1 day. Has the chills. Afebrile in office kgma.    Patient initially scheduled appointement for jaw pain that started last week,  but over the weekend developed nausea and vomiting, now has fever to 101F this  morning, associated with chills. Patient reports that she has not been feeling  well for two days, felt "foggy," and started vomiting last night, and  continued to vomit several times this morning. No abdominal pain or diarrhea.  No dysuria. Is able to tolerated fluids, voiding well.    In addition, has right sided jaw pain and right sided neck swelling that  started 4-5 days ago, seems to be progressing. Denies difficulty or pain with  swallowing or sore throat; however, does have pain with chewing, blowing nose,  vomiting. Is difficult to open mouth. No drooling, no difficulty breathing. No  recent dental procedures, had wisdom teeth removed 2 years ago. No ear pain.    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 Otherwise, no sick contacts.    Patient has received COVID vaccine.    ------      **ROS:**    _GENERAL_ :       * Negative for: sore throat, difficulty swallowing, drooling, difficulty breathing, diarrhea.     ------        * Positive for: fever, chills, vomiting; jaw pain,  trismus, neck swelling.     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 01/30/11 no albuterol in >78yr.only EIA. very mild intermittent. not recquirg Albuterol , 01/2016    ------        * R radial buckle Fx--12/26/05, RESOLVED        * vasovagal reaction with pain - syncope, urinary incontinence - 12/08, refer to Dr. Durward Mallard 12/10/09, RESOLVED        * went to [facility] for [facility]copy, 06/16/10. EKG, Lt axis deviation. Needs F up with cardiology per ED MD. DW Cardiology MD at [facility] by ED Md. 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 still ongoing lt knee discomfort, Re refer to ORTHO , 01/2016, on going 02/2017        *  Father declined HPV vaccine 01/2016, 02/2017. declined meningitis vaccine 02/2017        * refer to counselling for Anxiety 02/2017        * Vasovagal episode        * Vasovagal episode         ------      **Surgical History:**    ------        * none     ------    ---     **Hospitalization/Major Diagnostic Procedure:**    ------        * none     ------    ---     **Family History:**    ------     Mother: alive, healthy    ------     Father: alive, healthy    ------    ---      **Social History:**    ------     Lives w/ parents.    ------    ---      **Medications:**    ------    Taking     * Aviane 0.1-20 MG-MCG Tablet 1 tablet Orally Once a day    ------    Not-Taking/PRN     * Claritin 10 mg tablet 1 tab(s) orally once a day    ------        * Flonase 50 mcg/inh spray 1 spray(s) intranasally once a day, Notes: *please review for potential _update for e-prescription and drug interaction check*    ------        * ProAir HFA 90 mcg/inh aerosol 2 puff(s) inhaled 4 times a day, Notes: *please review for potential _update for e-prescription and drug interaction check*    ------        *  ProAir HFA 108 (90 Base) MCG/ACT Aerosol Solution 2 puff as needed inhalation every 4-6 hrs PRN    ------        * Vitamin B2 25 mg tablet 1 tab(s) orally once a day, Notes: *please review for potential _update for e-prescription and drug interaction check*    ------        * Medication List reviewed and reconciled with the patient    ------    ---      **Allergies:**    ------        * N.K.D.A.    ------    ---         **Objective:**        ---      **Vitals:** Ht-in 62.75, Wt-lbs 130.5, BMI 23.30, BP 110/78, Temp 99.9 ta,  BSA 1.62, Wt-kg 59.19, Wt %ile 55.45, Wt Change 10 lb, BMI Percentile 67.38.    ------      **Examination:    ** _Pediatric Exam:_      * GENERAL APPEARANCE: appears ill, pale and fatigued.     ------        * SKIN: normal; no erythema, no rash.     ------        * EYES: PERRLA, EOMI, conjunctiva clear.     ------        * EARS: canals normal.     ------        * NOSE: clear rhinorrhea.     ------        * ORAL CAVITY: unable to open mouth widely, from what is visualized, posterior oropharynx with mild erythema, no palatal lesions, buccal mucosal normal, tongue with white  plaque, uvula midline, unable to visualize tonsils.     ------        * NECK: **right mandibular angle blunted by soft tissue swelling without erythema or induration; there are several tender, mobile 1cm lymph nodes along the right mandible; below the mandible at the right lateral neck overlying the SCM area there is swelling and a fluid collection, very tender to palpation; ROM of neck limited due to pain**.     ------        * HEART: regular rhythm, no murmurs.     ------        * LUNGS:  clear, equal breath sounds bilaterally.     ------            **Assessment:**        ---      **Assessment:**          * Vomiting - 787.03 (Primary)     ------        * Localized swelling, mass or lump of neck - R22.1     ------        * Oral contraceptive pill surveillance - Z30.41     ------         **Plan:**        ---       **1\. Vomiting**       * **Lab:** Urinalysis  abnormal   Value Reference Range    ---------    Sp gravity 1.020     pH 6.0     ------------    Leuk small     ------------    Nitrate neg     ------------    Protein trace     ------------    Glucose neg     ------------    Ketones large     ------------    Meryl Dare neg     ------------    Bili neg     ------------    Heme small     ------------     [place] Dove 12/02/2019 9:26:44 AM > [facility]    ------    *  **Lab:** Rapid Strep Screening  Negative  Value Reference Range    ---------    result negative      Gillis,Kathleen , [place] 12/02/2019 9:34:08 AM > [facility] seen    ------    *  **Lab:** STREPTOCOCCUS, GROUP A CULTURE     **2\. Localized swelling, mass or lump of neck**    Notes:    Patient is ill appearing and has fever, vomiting and right sided neck pain and  swelling associated with tender submandibular lymphadenopathy and trismus.  Given the unilateral neck pain and swelling associated with trismus and  systemic symptoms, there is concern for deep neck space infection verses  Lymphadenitis. However, cannot rule out parapharyngeal, retropharyngeal,  tonsillar abscess, and will need imaging to do so. Pt is unable to open mouth  wide enough be evaluate for posterior pharyneal bulging or tonsillar swelling.  In addition to imaging, patient will likely also need labs, including blood  cultures, and thus was sent to the ER for further management. Call [facility] ED Expect Line to notify of patient's status.      .    **3\. Oral contraceptive pill surveillance**    Continue Aviane Tablet, 0.1-20 MG-MCG, 1 tablet, Orally, Once a day, 28 days,  28 Tablet, Refills 3 .     **Procedure Codes:**    ------        *  02725 U/A QUAL PROCEDURE    ------        * 36644 STREP A ASSAY W/OPTIC    ------    ---      **Follow Up:** prn    ------    ---    ---        ---     Electronically signed by Carmelina Paddock on 12/02/2019 at 10:48 AM EDT    Sign off status: Completed          * * *      **Provider:** Carmelina Paddock, MD  **Date:** 12/02/2019    ------

## 2019-12-02 NOTE — Progress Notes (Signed)
 .  Progress Notes  .  Patient: Alison Hodges  Provider: Carmelina Paddock    .  DOB: 2001-05-19 Age: 19 Y Sex: Female  .  PCP: Stormy Card MD  Date: 12/02/2019  .  --------------------------------------------------------------------------------  .  REASON FOR APPOINTMENT  .  1. OCP, HPV3, JAW PAIN  .  HISTORY OF PRESENT ILLNESS  .  GENERAL:  private HPVPer Mother, patient with fever  101 x 2 days. OTC Tylenol, ld last night.Vomitting x 1 day. Has  the chills. Afebrile in office kgma.   Patient initially scheduled appointement for jaw pain that  started last week, but over the weekend developed nausea and  vomiting, now has fever to 101F this morning, associated with  chills. Patient reports that she has not been feeling well for  two days, felt "foggy," and started vomiting last night, and  continued to vomit several times this morning. No abdominal pain  or diarrhea. No dysuria. Is able to tolerated fluids, voiding  well.In addition, has right sided jaw pain and right sided neck  swelling that started 4-5 days ago, seems to be progressing.  Denies difficulty or pain with swallowing or sore throat;  however, does have pain with chewing, blowing nose, vomiting. Is  difficult to open mouth. No drooling, no difficulty breathing. No  recent dental procedures, had wisdom teeth removed 2 years ago.  No ear pain. 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  Otherwise, no sick contacts.Patient has received COVID vaccine.  .  CURRENT MEDICATIONS  .  Taking Aviane 0.1-20 MG-MCG Tablet 1 tablet Orally Once a day  Not-Taking/PRN Claritin 10 mg tablet 1 tab(s) orally once a day  Not-Taking/PRN Flonase 50 mcg/inh spray 1 spray(s) intranasally  once a day, Notes: *please review for potential _update for  e-prescription and drug interaction check*  Not-Taking/PRN ProAir HFA 90 mcg/inh aerosol 2 puff(s) inhaled 4  times a day, Notes: *please review for potential _update for  e-prescription and drug interaction  check*  Not-Taking/PRN ProAir HFA 108 (90 Base) MCG/ACT Aerosol Solution  2 puff as needed inhalation every 4-6 hrs PRN  Not-Taking/PRN Vitamin B2 25 mg tablet 1 tab(s) orally once a  day, Notes: *please review for potential _update for  e-prescription and drug interaction check*  Medication List reviewed and reconciled with the patient  .  PAST MEDICAL HISTORY  .  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 01/30/11 no albuterol in >67yr.only EIA.  very mild intermittent. not recquirg Albuterol , 01/2016  R radial buckle Fx--12/26/05, RESOLVED  vasovagal reaction with pain - syncope, urinary incontinence -  12/08, refer to Dr. Durward Mallard 12/10/09, RESOLVED  went to [facility] for syncopy, 06/16/10. EKG, Lt axis deviation. Needs  F up with cardiology per ED MD. DW Cardiology MD at [facility] by ED Md.  seen By cardiology, normal vaso vagal, No f up, Neurology  wnl.RESOLVED 01/2016  s shaped scoliosis - 10', RESOLVED 01/2016, 02/2017  Knee pain - 06/30/13 - osgood schlatter & mild chondromalacia.  still ongoing lt knee discomfort, Re refer to ORTHO , 01/2016, on  going 02/2017  Father declined HPV vaccine 01/2016, 02/2017. declined meningitis  vaccine 02/2017  refer to counselling for Anxiety 02/2017  Vasovagal episode  Vasovagal episode  .  ALLERGIES  .  N.K.D.A.  .  SURGICAL HISTORY  .  none  .  FAMILY HISTORY  .  Mother: alive, healthy  Father: alive, healthy  .  SOCIAL HISTORY  .  Lives w/ parents.  Marland Kitchen  HOSPITALIZATION/MAJOR DIAGNOSTIC PROCEDURE  .  none  .  REVIEW OF SYSTEMS  .  GENERAL:  .  Negative for:    sore throat, difficulty swallowing, drooling,  difficulty breathing, diarrhea . Positive for:    fever, chills,  vomiting; jaw pain, trismus, neck swelling .  Marland Kitchen  VITAL SIGNS  .  Ht-in 62.75, Wt-lbs 130.5, BMI 23.30, BP 110/78, Temp 99.9 ta,  BSA 1.62, Wt-kg 59.19, Wt %ile 55.45, Wt Change 10 lb, BMI  Percentile 67.38.  Marland Kitchen  EXAMINATION  .  Pediatric Exam:  GENERAL APPEARANCE:appears ill, pale and fatigued.  Marland Kitchen  SKIN:normal; no erythema, no rash.  Marland Kitchen  EYES:PERRLA,  EOMI, conjunctiva clear.  Marland Kitchen  EARS:canals normal.  .  NOSE:clear rhinorrhea.  .  ORAL CAVITY:unable to open mouth widely, from what is visualized,  posterior oropharynx with mild erythema, no palatal lesions,  buccal mucosal normal, tongue with white plaque, uvula midline,  unable to visualize tonsils.  Marland Kitchen  NECK:right mandibular angle blunted by soft tissue swelling  without erythema or induration; there are several tender, mobile  1cm lymph nodes along the right mandible; below the mandible at  the right lateral neck overlying the SCM area there is swelling  and a fluid collection, very tender to palpation; ROM of neck  limited due to pain.  Marland Kitchen  HEART:regular rhythm, no murmurs.  Marland Kitchen  LUNGS: clear, equal breath sounds bilaterally.  .  ASSESSMENTS  .  Vomiting - 787.03 (Primary)  .  Localized swelling, mass or lump of neck - R22.1  .  Oral contraceptive pill surveillance - Z30.41  .  TREATMENT  .  Vomiting  LAB: Urinalysis  abnormal Sp gravity     1.020     ()  pH     6.0     ()  Leuk     small     ()  Nitrate     neg     ()  Protein     trace     ()  Glucose     neg     ()  Ketones     large     ()  Urobil     neg     ()  Bili     neg     ()  Heme     small     ()  .  [place]yn[place] Dove 12/02/2019 9:26:44 AM > [facility]  .  .  LAB: Rapid Strep Screening  Negative result     negative     ()  .  [place] , [place] 12/02/2019 9:34:08 AM > [facility] seen  .  Marland Kitchen  LAB: STREPTOCOCCUS, GROUP A CULTURE  .  Marland Kitchen  Localized swelling, mass or lump of neck  Notes:  .  Patient is ill appearing and has fever, vomiting and right sided  neck pain and swelling associated with tender submandibular  lymphadenopathy and trismus. Given the unilateral neck pain and  swelling associated with trismus and systemic symptoms, there is  concern for deep neck space infection verses Lymphadenitis.  However, cannot rule out parapharyngeal, retropharyngeal,  tonsillar abscess, and will need imaging to do so. Pt is unable  to open mouth wide enough be evaluate for posterior  pharyneal  bulging or tonsillar swelling. In addition to imaging, patient  will likely also need labs, including blood cultures, and thus  was sent to the ER for further management. Call [facility] ED Expect Line to notify of  patient's status.  .  .  .  .  Oral contraceptive pill surveillance  Continue Aviane Tablet, 0.1-20 MG-MCG, 1 tablet, Orally, Once a  day, 28 days, 28 Tablet, Refills 3  .  PROCEDURE CODES  .  81002 U/A QUAL PROCEDURE  .  [id number] STREP A ASSAY W/OPTIC  .  FOLLOW UP  .  prn  .  Electronically signed by Carmelina Paddock on  12/02/2019 at 10:48 AM EDT  .  Document electronically signed by Carmelina Paddock    .

## 2019-12-04 ENCOUNTER — Ambulatory Visit: Admitting: Student in an Organized Health Care Education/Training Program

## 2019-12-04 MED ORDER — Ondansetron: 4 | 9 | Freq: Three times a day (TID) | 0 refills | 0 days | Status: AC

## 2019-12-04 NOTE — Progress Notes (Signed)
* * *      Alison Hodges, Alison Hodges **DOB:** [birth year] (19 yo F) **Acc No.** [id number] **DOS:**  12/04/2019    ---       Page Spiro**    ------    46 Y old Female, DOB: 05-15-2001    [address] [place], [place] [address]    Home: [phone]    Provider: Carmelina Paddock        * * *    Telephone Encounter    ---    Answered by  Thamas Jaegers Date: 12/04/2019       Time: 05:04 PM    Caller  [name]    ------            Reason  rx for zofran            Message                     Mardene Celeste calling asking if she can get a refill of the zofran 4mg  tabs that [facility] ED gave her.  Patient states she is still vommitting 1-2 times a day.  Not eating much because she knows she will vomit.  Does state the zofran helps.  Will discuss with Dr Dareen Piano.Marland Kitchenkmw                Action Taken                     [place] , [place] 12/04/2019 5:06:51 PM >      Charmian Muff 12/04/2019 5:48:24 PM > Spoke to Dr Dareen Piano and ok to send zofran 4mg  1 tab q8rs prn for a 3 day supply dispensing 9tabs.  Per patient this is what hospital gave her.  Patient aware script being sent.  Escript sent.Marland Kitchenkmw                Refills Start Ondansetron Tablet Disintegrating, 4 MG, Orally, 9, 1 tablet on  the tongue and allow to dissolve, every 8 hours as needed for nausea, 3 days,  Refills=0    ------          * * *                ---          * * *         Provider: Carmelina Paddock 12/04/2019    ---    Note generated by eClinicalWorks EMR/PM Software ([email or web address])

## 2019-12-09 ENCOUNTER — Ambulatory Visit: Admit: 2019-12-09

## 2019-12-09 ENCOUNTER — Ambulatory Visit: Admitting: Student in an Organized Health Care Education/Training Program

## 2019-12-09 NOTE — Progress Notes (Signed)
 BREENA, BEVACQUA **DOB:** April 19, 2001 (19 yo F) **Acc No.** [id number] **DOS:**  12/09/2019    ---      **Progress Notes**    ---    **Patient:** Alison Hodges, Alison Hodges     **Account Number:** [id number] **External MRN:** [id number]  **Provider:** Carmelina Paddock, MD     **DOB:** 12-18-2000 **Age:** 79 Y **Sex:** Female  **Date:** 12/09/2019     **Phone:** [phone]  **CHN#:** [id number]     **Address:** [address] [place], [id number]     **Pcp:** Stormy Card, MD        * * *        **Subjective:**        ---      **Chief Complaints:**    ------        * ER follow up    ------    ---      **HPI:**    _GENERAL_ :    Informant: self.    Seen in clinic last week for jaw pain and neck swelling associated with fever  and chills. Concern for lymphadenitis vs deep neck infection and was sent to  the ED for labs and imaging. In the ED, neck ultrasound consistent with  lymphadenitis. Patient was started on Amoxicillin for 10 days with scheduled  follow up today.    Has completed 7 days of treatment. Feeling better, particularly today, but not  yet 100%. Neck swelling and tenderness are down, but still present. No more  fevers. eating and drinking. Up until yesterday, continue to vomit several  times per day despite zofran (? GI bug verses SE of ABX). Vomiting resolved  yesterday. No diarrhea. Energy is much improved today.    ------      **ROS:**    _GENERAL_ :       * Negative for: fever, trismus, sore throat, difficulty swallowing, drooling, difficulty breathing, diarrhea.     ------        * Positive for: vomiting, jaw swelling/tenderness.     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 very mild intermittent. not recquirg Albuterol , 01/2016    ------        * R radial buckle Fx--12/26/05, RESOLVED        * vasovagal reaction with pain - syncope, urinary incontinence - 12/08, refer to Dr. Durward Mallard 12/10/09, RESOLVED        * went to [facility] for syncopy,  06/16/10. EKG, Lt axis deviation. Needs F up with cardiology per ED MD. [facility] Cardiology MD at [facility] by ED Md. 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 still ongoing lt knee discomfort, Re refer to ORTHO , 01/2016, on going 02/2017        * Father declined HPV vaccine 01/2016, 02/2017. declined meningitis vaccine 02/2017        * refer to counselling for Anxiety 02/2017        * Vasovagal episode        * Vasovagal episode        * 12/02/19 - Right sided anterior cervical Lymphadenitis, tx with abx         ------      **  Surgical History:**    ------        * none     ------    ---     **Hospitalization/Major Diagnostic Procedure:**    ------        * none     ------    ---     **Family History:**    ------     Mother: alive, healthy    ------     Father: alive, healthy    ------    ---      **Social History:**    ------     Lives w/ parents.    ------    ---      **Medications:**    ------    Taking     * Aviane 0.1-20 MG-MCG Tablet 1 tablet Orally Once a day    ------        * Ondansetron 4 MG Tablet Disintegrating 1 tablet on the tongue and allow to dissolve Orally every 8 hours as needed for nausea    ------    Not-Taking/PRN     * Claritin 10 mg tablet 1 tab(s) orally once a day    ------        * Flonase 50 mcg/inh spray 1 spray(s) intranasally once a day, Notes: *please review for potential _update for e-prescription and drug interaction check*    ------        * ProAir HFA 90 mcg/inh aerosol 2 puff(s) inhaled 4 times a day, Notes: *please review for potential _update for e-prescription and drug interaction check*    ------        * ProAir HFA 108 (90 Base) MCG/ACT Aerosol Solution 2 puff as needed inhalation every 4-6 hrs PRN    ------        * Vitamin B2 25 mg tablet 1 tab(s) orally once a day, Notes: *please review  for potential _update for e-prescription and drug interaction check*    ------        * Medication List reviewed and reconciled with the patient    ------    ---      **Allergies:**    ------        * N.K.D.A.    ------    ---         **Objective:**        ---      **Vitals:** Ht-in 62.75, Wt-lbs 129, BMI 23.03, Temp 97.5 ta, BSA 1.61, Wt-  kg 58.51, Wt %ile 52.75, Wt Change -1.5 lb, BMI Percentile 64.96.    ------      **Examination:    ** _Pediatric Exam:_      * GENERAL APPEARANCE: well appearing, smiling, active and alert.     ------        * SKIN: normal; no erythema, no rash.     ------        * EYES: PERRLA, EOMI, conjunctiva clear.     ------        * EARS: canals normal.     ------        * NOSE: clear rhinorrhea.     ------        * ORAL CAVITY: call open mouth fully with ease; OP without erythema, tonsils normal, no palatal lesions, uvula midline.     ------        * NECK: **right mandibular angle with interval decreased swelling compared to left, 3 tender mobile 0.5cm submandibular lymph nodes  palpated, resolution of the swelling and tenderness at the right lateral neck; full ROM of neck**.     ------        * HEART: regular rhythm, no murmurs.     ------        * LUNGS:  clear, equal breath sounds bilaterally.     ------            **Assessment:**        ---      **Assessment:**          * Lymphadenitis, acute - L04.9 (Primary)     ------        **Plan:**        ---       **1\. Lymphadenitis, acute**    Notes: Significantly improved with treatment with Amoxicillin. Will complete  10 days of Amoxicillin. Discussed that should return to clinic if any  worsening of pain or swelling, or if develops fever, after completion of abx.    ---     **Follow Up:** prn    ------    ---    ---        ---    Electronically signed by Carmelina Paddock on 12/09/2019 at 04:23 PM EDT    Sign off status: Completed          * * *      **Provider:** Carmelina Paddock, MD  **Date:**  12/09/2019    ------

## 2019-12-09 NOTE — Progress Notes (Signed)
 .  Progress Notes  .  Patient: Alison Hodges  Provider: Carmelina Paddock    .  DOB: [birth year] Age: 19 Y Sex: Female  .  PCP: Stormy Card MD  Date: 12/09/2019  .  --------------------------------------------------------------------------------  .  REASON FOR APPOINTMENT  .  1. ER follow up  .  HISTORY OF PRESENT ILLNESS  .  GENERAL:  Informant: self.   Seen in clinic last week for jaw pain and neck swelling  associated with fever and chills. Concern for lymphadenitis vs  deep neck infection and was sent to the ED for labs and imaging.  In the ED, neck ultrasound consistent with lymphadenitis. Patient  was started on Amoxicillin for 10 days with scheduled follow up  today. Has completed 7 days of treatment. Feeling better,  particularly today, but not yet 100%. Neck swelling and  tenderness are down, but still present. No more fevers. eating  and drinking. Up until yesterday, continue to vomit several times  per day despite zofran (? GI bug verses SE of ABX). Vomiting  resolved yesterday. No diarrhea. Energy is much improved today.  .  CURRENT MEDICATIONS  .  Taking Aviane 0.1-20 MG-MCG Tablet 1 tablet Orally Once a day  Taking Ondansetron 4 MG Tablet Disintegrating 1 tablet on the  tongue and allow to dissolve Orally every 8 hours as needed for  nausea  Not-Taking/PRN Claritin 10 mg tablet 1 tab(s) orally once a day  Not-Taking/PRN Flonase 50 mcg/inh spray 1 spray(s) intranasally  once a day, Notes: *please review for potential _update for  e-prescription and drug interaction check*  Not-Taking/PRN ProAir HFA 90 mcg/inh aerosol 2 puff(s) inhaled 4  times a day, Notes: *please review for potential _update for  e-prescription and drug interaction check*  Not-Taking/PRN ProAir HFA 108 (90 Base) MCG/ACT Aerosol Solution  2 puff as needed inhalation every 4-6 hrs PRN  Not-Taking/PRN Vitamin B2 25 mg tablet 1 tab(s) orally once a  day, Notes: *please review for potential _update for  e-prescription and drug  interaction check*  Medication List reviewed and reconciled with the patient  .  PAST MEDICAL HISTORY  .  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 01/30/11 no albuterol in >56yr.only EIA.  very mild intermittent. not recquirg Albuterol , 01/2016  R radial buckle Fx--12/26/05, RESOLVED  vasovagal reaction with pain - syncope, urinary incontinence -  12/08, refer to Dr. Durward Mallard 12/10/09, RESOLVED  went to [facility] for syncopy, 06/16/10. EKG, Lt axis deviation. Needs  F up with cardiology per ED MD. DW Cardiology MD at [facility] by ED Md.  seen By cardiology, normal vaso vagal, No f up, Neurology  wnl.RESOLVED 01/2016  s shaped scoliosis - 10', RESOLVED 01/2016, 02/2017  Knee pain - 06/30/13 - osgood schlatter & mild chondromalacia.  still ongoing lt knee discomfort, Re refer to ORTHO , 01/2016, on  going 02/2017  Father declined HPV vaccine 01/2016, 02/2017. declined meningitis  vaccine 02/2017  refer to counselling for Anxiety 02/2017  Vasovagal episode  Vasovagal episode  12/02/19 - Right sided anterior cervical Lymphadenitis, tx with abx  .  ALLERGIES  .  N.K.D.A.  .  SURGICAL HISTORY  .  none  .  FAMILY HISTORY  .  Mother: alive, healthy  Father: alive, healthy  .  SOCIAL HISTORY  .  Lives w/ parents.  .  HOSPITALIZATION/MAJOR DIAGNOSTIC PROCEDURE  .  none  .  REVIEW OF SYSTEMS  .  GENERAL:  .  Negative for:    fever, trismus, sore throat,  difficulty  swallowing, drooling, difficulty breathing, diarrhea . Positive  for:    vomiting, jaw swelling/tenderness .  Marland Kitchen  VITAL SIGNS  .  Ht-in 62.75, Wt-lbs 129, BMI 23.03, Temp 97.5 ta, BSA 1.61, Wt-kg  58.51, Wt %ile 52.75, Wt Change -1.5 lb, BMI Percentile 64.96.  Marland Kitchen  EXAMINATION  .  Pediatric Exam:  GENERAL APPEARANCE:well appearing, smiling, active and alert.  Marland Kitchen  SKIN:normal; no erythema, no rash.  Marland Kitchen  EYES:PERRLA, EOMI, conjunctiva clear.  Marland Kitchen  EARS:canals normal.  .  NOSE:clear rhinorrhea.  .  ORAL CAVITY:call open mouth fully with ease; OP without erythema,  tonsils normal, no palatal lesions, uvula  midline.  Marland Kitchen  NECK:right mandibular angle with interval decreased swelling  compared to left, 3 tender mobile 0.5cm submandibular lymph nodes  palpated, resolution of the swelling and tenderness at the right  lateral neck; full ROM of neck.  Marland Kitchen  HEART:regular rhythm, no murmurs.  Marland Kitchen  LUNGS: clear, equal breath sounds bilaterally.  .  ASSESSMENTS  .  Lymphadenitis, acute - L04.9 (Primary)  .  TREATMENT  .  Lymphadenitis, acute  Notes: Significantly improved with treatment with Amoxicillin.  Will complete 10 days of Amoxicillin. Discussed that should  return to clinic if any worsening of pain or swelling, or if  develops fever, after completion of abx.  .  FOLLOW UP  .  prn  .  Electronically signed by Carmelina Paddock on  12/09/2019 at 04:23 PM EDT  .  Document electronically signed by Carmelina Paddock    .

## 2019-12-16 ENCOUNTER — Ambulatory Visit: Admit: 2019-12-16

## 2019-12-16 ENCOUNTER — Ambulatory Visit: Admitting: Pediatrics

## 2019-12-16 NOTE — Progress Notes (Signed)
 [name], [name] **DOB:** 10-09-2000 (19 yo F) **Acc No.** [id number] **DOS:**  12/16/2019    ---      **Progress Notes**    ---    **Patient:** Alison Hodges, Alison Hodges     **Account Number:** [id number] **External MRN:** [id number]  **Provider:** Jeani Hawking, NP     **DOB:** [birth year] **Age:** 19 Y **Sex:** Female  **Date:** 12/16/2019     **Phone:** [phone]  **CHN#:** [id number]     **Address:** [address] [place], [place]     **Pcp:** Stormy Card, MD        * * *        **Subjective:**        ---      **Chief Complaints:**    ------        * GARDISIL SHOT    ------        * KE Immunes    ------    ---      **HPI:**    _COVID SCREEN_ :    Here for HPV #3. No previous problems with vaccines. MAC RN.    Denton Meek :    no previous immune reaction    no egg allergy    healthy today.    ------      **Medications:**    ------    Taking     * Aviane 0.1-20 MG-MCG Tablet 1 tablet Orally Once a day    ------        * Ondansetron 4 MG Tablet Disintegrating 1 tablet on the tongue and allow to dissolve Orally every 8 hours as needed for nausea    ------    Not-Taking/PRN     * Claritin 10 mg tablet 1 tab(s) orally once a day    ------        * Flonase 50 mcg/inh spray 1 spray(s) intranasally once a day, Notes: *please review for potential _update for e-prescription and drug interaction check*    ------        * ProAir HFA 90 mcg/inh aerosol 2 puff(s) inhaled 4 times a day, Notes: *please review for potential _update for e-prescription and drug interaction check*    ------        * ProAir HFA 108 (90 Base) MCG/ACT Aerosol Solution 2 puff as needed inhalation every 4-6 hrs PRN    ------        * Vitamin B2 25 mg tablet 1 tab(s) orally once a day, Notes: *please review for potential _update for e-prescription and drug interaction check*    ------    ---      **Allergies:**    ------    ---        **Objective:**        ---      **Examination:    ** _GENERAL:_    defer.        ------              **Assessment:**        ---      **Assessment:**          * Encounter for immunization - Z23 (Primary)     ------        **Plan:**        ---        ------      **Immunizations:**       * HPV Vaccine (HPV9) : 0.5 (Route: Intramuscular) given by Darlyn Chamber on Left Arm    ------      **  Procedure Codes:**    ------        * 51761 HPV Vaccine (HPV9)    ------        * 60737 Immunization Injection, single/first    ------        * 10626 MED SERV, EVE/WKEND/HOLIDAY    ------    ---      **Follow Up:** prn    ------    ---    ---        ---    Electronically signed by Jeani Hawking on 12/16/2019 at 06:23 PM EDT    Sign off status: Completed          * * *      **Provider:** Jeani Hawking, NP  **Date:** 12/16/2019    ------

## 2019-12-16 NOTE — Progress Notes (Signed)
.    Progress Notes  .  Patient: Alison Hodges  Provider: Jeani Hawking    .  DOB: 10-27-2000 Age: 19 Y Sex: Female  .  PCP: Stormy Card MD  Date: 12/16/2019  .  --------------------------------------------------------------------------------  .  REASON FOR APPOINTMENT  .  1. GARDISIL SHOT  .  2. KE Immunes  .  HISTORY OF PRESENT ILLNESS  .  COVID SCREEN:  Here for HPV #3. No previous  problems with vaccines. MAC RN.  .  GENERAL:   no previous immune reactionno egg allergyhealthy today.  .  CURRENT MEDICATIONS  .  Taking Aviane 0.1-20 MG-MCG Tablet 1 tablet Orally Once a day  Taking Ondansetron 4 MG Tablet Disintegrating 1 tablet on the  tongue and allow to dissolve Orally every 8 hours as needed for  nausea  Not-Taking/PRN Claritin 10 mg tablet 1 tab(s) orally once a day  Not-Taking/PRN Flonase 50 mcg/inh spray 1 spray(s) intranasally  once a day, Notes: *please review for potential _update for  e-prescription and drug interaction check*  Not-Taking/PRN ProAir HFA 90 mcg/inh aerosol 2 puff(s) inhaled 4  times a day, Notes: *please review for potential _update for  e-prescription and drug interaction check*  Not-Taking/PRN ProAir HFA 108 (90 Base) MCG/ACT Aerosol Solution  2 puff as needed inhalation every 4-6 hrs PRN  Not-Taking/PRN Vitamin B2 25 mg tablet 1 tab(s) orally once a  day, Notes: *please review for potential _update for  e-prescription and drug interaction check*  .  ALLERGIES  .  yes[Allergies Verified]  .  EXAMINATION  .  GENERAL: defer.  .  ASSESSMENTS  .  Encounter for immunization - Z23 (Primary)  .  PROCEDURE CODES  .  56701 HPV Vaccine (HPV9)  .  41030 Immunization Injection, single/first  .  [id number] MED SERV, EVE/WKEND/HOLIDAY  .  FOLLOW UP  .  prn  .  Electronically signed by Jeani Hawking on 12/16/2019  at 06:23 PM EDT  .  Document electronically signed by Jeani Hawking    .

## 2020-01-21 ENCOUNTER — Ambulatory Visit: Admit: 2020-01-21

## 2020-01-21 ENCOUNTER — Ambulatory Visit: Admitting: Pediatrics

## 2020-01-21 NOTE — Progress Notes (Signed)
 .  Progress Notes  .  Patient: Alison Hodges  Provider: Jeani Hawking    .  DOB: Mar 08, 2001 Age: 19 Y Sex: Female  .  PCP: Stormy Card MD  Date: 01/21/2020  .  --------------------------------------------------------------------------------  .  REASON FOR APPOINTMENT  .  1. 18 YEAR well adult  .  2. KE -- Physical 17 years  .  HISTORY OF PRESENT ILLNESS  .  COVID SCREEN:  Not VFC elig due age and private  insuance, UTD, Hgb, Urine GC.   trans hgb, GC.  Marland Kitchen  6-11 Years:  Concerns:  had covid 06/2019 and still has hoarse voice; wants to see GI,  will gag easily, very sensitive stomach per pt, will vomit often  per pt, c/o sometimes feeling like vomit coming into throat, not  associated with any types of foods per pt, if coughs too much  will gag or vomit, on sucralafate and omeprazole twice a day from  MD in [place].  Marland Kitchen  Interim History  No significant history.  .  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  .  Nutrition  Drinks 1% milk, good eater, usually well balanced.  .  Output  No problems voiding/stooling.  .  Sleep  No problems.  .  Environment  No smokers in home, Smoke detector in home, no TB risk, Carbon  Monoxide detector in home, No Firearms in home.  .  Dental  Dental Care in past 6 months, 28 teeth.  .  Friends  several friends.  .  After school activities  gets exercise.  .  Questions for Child:  Has had sex education/discussion:  yes.  .  Body development discussion, yet?  yes.  .  Dating/Sex/Sexuality questions?  no, discussed.  .  Do you ever feel down or depressed?  no.  .  Regular exercise  yes.  .  Have you begun having sex?  no.  .  Do you smoke cigarettes, drink alcohol, or use  other drugs?  No.  .  CURRENT MEDICATIONS  .  Taking Aviane 0.1-20 MG-MCG Tablet 1 tablet Orally Once a day  Taking Omeprazole 20 MG Capsule Delayed Release 1 capsule 30  minutes before morning meal Orally Once a  day  Not-Taking/PRN Claritin 10 mg tablet 1 tab(s) orally once a day  Not-Taking/PRN Flonase 50 mcg/inh spray 1 spray(s) intranasally  once a day, Notes: *please review for potential _update for  e-prescription and drug interaction check*  Not-Taking/PRN ProAir HFA 90 mcg/inh aerosol 2 puff(s) inhaled 4  times a day, Notes: *please review for potential _update for  e-prescription and drug interaction check*  Not-Taking/PRN Sucralfate 1 GM Tablet 1 tablet on an empty  stomach Orally Twice a day  Discontinued Ondansetron 4 MG Tablet Disintegrating 1 tablet on  the tongue and allow to dissolve Orally every 8 hours as needed  for nausea  Discontinued ProAir HFA 108 (90 Base) MCG/ACT Aerosol Solution 2  puff as needed inhalation every 4-6 hrs PRN  Discontinued Vitamin B2 25 mg tablet 1 tab(s) orally once a day,  Notes: *please review for potential _update for e-prescription  and drug interaction check*  Medication List reviewed and reconciled with the patient  .  PAST MEDICAL HISTORY  .  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 01/30/11 no albuterol in >64yr.only EIA.  very mild intermittent. not recquirg Albuterol , 01/2016. 01/21/20  - unknown when last used, >  70yrs ago.  R radial buckle Fx--12/26/05, RESOLVED  vasovagal reaction with pain - syncope, urinary incontinence -  12/08, refer to Dr. Durward Mallard 12/10/09, RESOLVED  went to [facility] for [facility]copy, 06/16/10. EKG, Lt axis deviation. Needs  F up with cardiology per ED MD. DW Cardiology MD at [facility] by ED Md.  seen By cardiology, normal vaso vagal, No f up, Neurology  wnl.RESOLVED 01/2016  s shaped scoliosis - 10', RESOLVED 01/2016, 02/2017  Knee pain - 06/30/13 - osgood schlatter & mild chondromalacia.  still ongoing lt knee discomfort, Re refer to ORTHO , 01/2016, on  going 02/2017  Father declined HPV vaccine 01/2016, 02/2017. declined meningitis  vaccine 02/2017  refer to counselling for Anxiety 02/2017  Vasovagal episode  Vasovagal episode  12/02/19 - Right sided anterior cervical Lymphadenitis, tx with  abx  .  ALLERGIES  .  N.K.D.A.  .  SURGICAL HISTORY  .  none  .  FAMILY HISTORY  .  Mother: alive, healthy  Father: alive, healthy  .  SOCIAL HISTORY  .  no Guns in Home. Parents: lives with parents. Home Smoke  Detector: yes. Seat Belt: yes. *Smoking: None smoker no TB risk.  Marland Kitchen  HOSPITALIZATION/MAJOR DIAGNOSTIC PROCEDURE  .  none  .  REVIEW OF SYSTEMS  .  PED Behavioral/Developmental:  .  General Behavioral    no concerns .  Marland Kitchen  PED HEENT/Resp:  .  Allergies/ Reactions No. Dental: Yes.  Marland Kitchen  PED GI/Nutrition:  .  Abdominal pain    no . Constipation No. Diarrhea No.  .  PED Musculoskeletal/Neuro:  Marland Kitchen  Musculoskeletal pain No.  .  ADO OBGYN History:  .  Cycle:    regular . Flow:    varies . Duration:    4-7 .  Dysmenorrhea?    no .  .  ADO Anticipatory Guidance:  .  Self-exam:    Self breast exam, Taught .  Marland Kitchen  VITAL SIGNS  .  Ht-in 62.75, Wt-lbs 124.75, BMI 22.27, BP 120/72, BSA 1.58, Wt-kg  56.59, Wt %ile 44, Wt Change -4.25 lb, BMI Percentile 56.94.  Marland Kitchen  EXAMINATION  .  Pediatric Exam:  GENERAL APPEARANCE: well nourished, alert,active.  Marland Kitchen  SKIN: normal.  .  EYES:red reflex +, PERRLA, fundi normal, EOMI.  Marland Kitchen  EARS:bilateral TM normal color, canals normal.  .  NOSE:nares patent and clear, mucosa normal.  .  ORAL CAVITY:moist mucus membranes, no lesions.  Marland Kitchen  NECK:supple,no lymphadenopathy,thyroid normal.  .  HEART:regular rhythm, no murmurs.  Marland Kitchen  LUNGS: clear, equal breath sounds bilaterally.  .  ABDOMEN: soft,nontender, no masses, normal bowel sounds.  .  GENITALIA:normal external genitalia, Tanner stage 5, normal  breasts.  Marland Kitchen  EXTREMITIES/BACK:good range of motion, no scoliosis.  Marland Kitchen  NEUROLOGIC EXAM: normal tone and motor development, normal  sensory system and reflexes, normal cranial nerves II-XII.  Marland Kitchen  DEVELOPMENTAL SCREENPSC-Y - no concerns.  .  ASSESSMENTS  .  Encounter for well adult exam with abnormal findings - Z00.01  (Primary)  .  Encounter for screening for infections with a predominantly  sexual mode of  transmission - Z11.3  .  Laryngitis - J04.0  .  History of COVID-19 - Z86.16  .  Gastroesophageal reflux disease without esophagitis - K21.9  .  TREATMENT  .  Encounter for well adult exam with abnormal  findings  LAB: Chlamydia Dna Probe  .  LAB: GC gonorrhea DNA probe  .  LAB: HEMOGLOBIN  14.0 g/dl HEMOGLOBIN  14.0 g/dl     ()  .  [place] , [place] 01/21/2020 3:35:33 PM >  .  .  Laryngitis  Notes: pt leaving for [place] in 2 weeks, ? d/t lingering effects from  covid vs irritation from chronic GERD, pt has been on omeprazole  and sucralafate w/o improvement, refer to ENT and GI for further  eval and treatment.  .  .  Others  Notes: Notes: advised on nutrition and good eating habits.routine  well care. anticipatory guidance as below. .  .  PREVENTIVE MEDICINE  .  Teen:  .  Alcohol   .  Breast Exam   .  Development   .  Drugs   .  Eating/Body Image   .  Emotions   .  Exercise   .  Hygiene   .  Injury Prevention   .  Nutrition/Weight   .  Physical Activity   .  Safe Sex   .  Seat Belt use   .  STD   .  Tobacco Avoidance   .  Marland Kitchen  PROCEDURE CODES  .  51102 BRIEF EMOTIONAL/BEHAV ASSMT, Modifiers: U1  .  99001 SPECIMEN HANDLING  .  FOLLOW UP  .  well child in 1 yr  .  Electronically signed by Jeani Hawking on 01/21/2020  at 04:36 PM EDT  .  Document electronically signed by Jeani Hawking    .

## 2020-01-21 NOTE — Progress Notes (Signed)
 Alison Hodges, Alison Hodges **DOB:** 10-20-2000 (19 yo F) **Acc No.** [id number] **DOS:**  01/21/2020    ---      **Progress Note**    ---    **Patient:** Alison Hodges, Alison Hodges     **Account Number:** [id number] **External MRN:** [id number]  **Provider:** Jeani Hawking, NP     **DOB:** 2000/09/16 **Age:** 19 Y **Sex:** Female  **Date:** 01/21/2020     **Phone:** [phone]  **CHN#:** [id number]     **Address:** [address], [place], [id number]     **Pcp:** Stormy Card, MD        * * *        **Subjective:**        ---      **Chief Complaints:**    ------        * 19 YEAR well adult    ------        * KE -- Physical 19 years    ------    ---      **HPI:**    _COVID SCREEN_ :    Not VFC elig due age and private insuance, UTD, Hgb, Urine GC.    trans hgb, GC.    _6-11 Years_ :    Concerns: had covid 06/2019 and still has hoarse voice; wants to see GI, will  gag easily, very sensitive stomach per pt, will vomit often per pt, c/o  sometimes feeling like vomit coming into throat, not associated with any types  of foods per pt, if coughs too much will gag or vomit, on sucralafate and  omeprazole twice a day from MD in [place].    Interim History No significant history.    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    Nutrition Drinks 1% milk, good eater, usually well balanced.    Output No problems voiding/stooling.    Sleep No problems.    Environment No smokers in home, Smoke detector in home, no TB risk, Carbon  Monoxide detector in home, No Firearms in home.    Dental Dental Care in past 6 months, 28 teeth.    Friends several friends.    After school activities gets exercise.    _Questions for Child_ :    Has had sex education/discussion: yes.    Body development discussion, yet? yes.    Dating/Sex/Sexuality questions? no, discussed.    Do you ever feel down or depressed? no.    Regular exercise yes.    Have you begun having sex? no.    Do you  smoke cigarettes, drink alcohol, or use other drugs? No.    ------     **ROS:**    _PED Behavioral/Developmental_ :       * General Behavioral no concerns.     ------    _PED HEENT/Resp_ :      * Allergies/ Reactions No.     ------        * Dental: Yes.     ------    _PED GI/Nutrition_ :      * Abdominal pain no.     ------        * Constipation No.     ------        * Diarrhea No.     ------    _PED Musculoskeletal/Neuro_ :      * Musculoskeletal pain No.     ------    _ADO OBGYN History_ :      *  Cycle: regular.     ------        * Flow: varies.     ------        * Duration: 4-7.     ------        * Dysmenorrhea? no.     ------    _ADO Anticipatory Guidance_ :      * Self-exam: Self breast exam, Taught.     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 very mild intermittent. not recquirg Albuterol , 01/2016. 01/21/20 - unknown when last used, >39yrs ago.    ------        * R radial buckle Fx--12/26/05, RESOLVED        * vasovagal reaction with pain - syncope, urinary incontinence - 12/08, refer to Dr. Durward Mallard 12/10/09, RESOLVED        * went to [facility] for [facility]copy, 06/16/10. EKG, Lt axis deviation. Needs F up with cardiology per ED MD. DW Cardiology MD at [facility] by ED Md. 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 still ongoing lt knee discomfort, Re refer to ORTHO , 01/2016, on going 02/2017        * Father declined HPV vaccine 01/2016, 02/2017. declined meningitis vaccine 02/2017        * refer to counselling for Anxiety 02/2017        * Vasovagal episode        * Vasovagal episode        * 12/02/19 - Right sided anterior cervical Lymphadenitis, tx with abx         ------      **Surgical History:**    ------        * none     ------    ---      **Hospitalization/Major Diagnostic Procedure:**    ------        * none     ------    ---     **Family History:**    ------     Mother: alive, healthy    ------     Father: alive, healthy    ------    ---      **Social History:**    ------     no Guns in Home.    Parents: lives with parents.    Home Smoke Detector: yes.    Seat Belt: yes.    *Smoking: None smoker     no TB risk.    ------    ---      **Medications:**    ------    Taking     * Aviane 0.1-20 MG-MCG Tablet 1 tablet Orally Once a day    ------        * Omeprazole 20 MG Capsule Delayed Release 1 capsule 30 minutes before morning meal Orally Once a day    ------    Not-Taking/PRN     * Claritin 10 mg tablet 1 tab(s) orally once a day    ------        * Flonase 50 mcg/inh spray 1 spray(s) intranasally once a day, Notes: *please review for potential _update for e-prescription and drug  interaction check*    ------        * ProAir HFA 90 mcg/inh aerosol 2 puff(s) inhaled 4 times a day, Notes: *please review for potential _update for e-prescription and drug interaction check*    ------        * Sucralfate 1 GM Tablet 1 tablet on an empty stomach Orally Twice a day    ------    Discontinued     * Ondansetron 4 MG Tablet Disintegrating 1 tablet on the tongue and allow to dissolve Orally every 8 hours as needed for nausea    ------        * ProAir HFA 108 (90 Base) MCG/ACT Aerosol Solution 2 puff as needed inhalation every 4-6 hrs PRN    ------        * Vitamin B2 25 mg tablet 1 tab(s) orally once a day, Notes: *please review for potential _update for e-prescription and drug interaction check*    ------        * Medication List reviewed and reconciled with the patient    ------    ---      **Allergies:**    ------        * N.K.D.A.    ------    ---         **Objective:**        ---      **Vitals:** Ht-in 62.75, Wt-lbs 124.75, BMI 22.27, BP 120/72, BSA 1.58, Wt-  kg 56.59, Wt %ile 44, Wt Change -4.25 lb, BMI Percentile  56.94.    ------      **Examination:    ** _Pediatric Exam:_      * GENERAL APPEARANCE:  well nourished, alert,active.     ------        * SKIN:  normal.     ------        * EYES: red reflex +, PERRLA, fundi normal, EOMI.     ------        * EARS: bilateral TM normal color, canals normal.     ------        * NOSE: nares patent and clear, mucosa normal.     ------        * ORAL CAVITY: moist mucus membranes, no lesions.     ------        * NECK: supple,no lymphadenopathy,thyroid normal.     ------        * HEART: regular rhythm, no murmurs.     ------        * LUNGS:  clear, equal breath sounds bilaterally.     ------        * ABDOMEN:  soft,nontender, no masses, normal bowel sounds.     ------        * GENITALIA: normal external genitalia, Tanner stage 5, normal breasts.     ------        * EXTREMITIES/BACK: good range of motion, no scoliosis.     ------        * NEUROLOGIC EXAM:  normal tone and motor development, normal sensory system and reflexes, normal cranial nerves II-XII.     ------        * DEVELOPMENTAL SCREEN PSC-Y - no concerns.     ------            **Assessment:**        ---      **Assessment:**          * Encounter  for well adult exam with abnormal findings - Z00.01 (Primary)     ------        * Encounter for screening for infections with a predominantly sexual mode of transmission - Z11.3     ------        * Laryngitis - J04.0     ------        * History of COVID-19 - Z86.16     ------        * Gastroesophageal reflux disease without esophagitis - K21.9     ------        **Plan:**        ---       **1\. Encounter for well adult exam with abnormal findings**       * **Lab:** Chlamydia Dna Probe        *  **Lab:** GC gonorrhea DNA probe        *  **Lab:** HEMOGLOBIN  14.0 g/dl   Value Reference Range    ---------    HEMOGLOBIN 14.0 g/dl      [place] ,  01/21/2020 3:35:33 PM >    ------     **2\. Laryngitis**    Notes: pt leaving for [place]  in 2 weeks, ? d/t lingering effects from covid vs  irritation from chronic GERD, pt has been on omeprazole and sucralafate w/o  improvement, refer to ENT and GI for further eval and treatment.    **3\. Others**    Notes: Notes: advised on nutrition and good eating habits.    routine well care. anticipatory guidance as below.  .     **Procedure Codes:**    ------        * 40102 BRIEF EMOTIONAL/BEHAV ASSMT, Modifiers: U1     ------        * 99001 SPECIMEN HANDLING    ------    ---      **Preventive:**    Teen:    Alcohol .    Breast Exam .    Development .    Drugs .    Eating/Body Image .    Emotions .    Exercise .    Hygiene .    Injury Prevention .    Nutrition/Weight .    Physical Activity .    Safe Sex .    Seat Belt use .    STD .    Tobacco Avoidance .    ------     **Follow Up:** well child in 1 yr    ------    ---    ---        ---    Electronically signed by Jeani Hawking on 01/21/2020 at 04:36 PM EDT    Sign off status: Completed          * * *      **Provider:** Jeani Hawking, NP  **Date:** 01/21/2020    ------

## 2020-01-22 LAB — HX MOLECULAR BIO
HX CHLAMYDIA DNA PROBE: NEGATIVE
HX CHLAMYDIA DNA PROBE: NEGATIVE
HX GC DNA PROBE: NEGATIVE
HX GC DNA PROBE: NEGATIVE

## 2020-01-22 LAB — HX CHLAMYDIA: HX CHLAMYDIA DNA PROBE: NEGATIVE

## 2020-01-23 ENCOUNTER — Ambulatory Visit

## 2020-03-08 ENCOUNTER — Ambulatory Visit: Admitting: Student in an Organized Health Care Education/Training Program

## 2020-03-08 NOTE — Progress Notes (Signed)
* * *      MINDY, GALI **DOB:** [birth year] (19 yo F) **Acc No.** [id number] **DOS:**  03/08/2020    ---       Page Spiro**    ------    55 Y old Female, DOB: [birth year]    [address] [place], [place] [address]    Home: [phone]    Provider: Carmelina Paddock        * * *    Telephone Encounter    ---    Answered by  Thamas Jaegers Date: 03/08/2020       Time: 04:22 PM    Caller  [phone]    ------            Reason  ocp refill            Message                     Patient calling because she needs a refill on her borth control.  She is in school and needs it to go to out  of state pharmacy [facility] [address] [place]. [place].  Phone [phone] and fax [phone]...kmw                Action Taken                     [place] , [place] 03/08/2020 4:40:19 PM > Meraly are you able to have [facility] add a new pharmacy and then I can send message to provider to refill?            Kennedy,Meraly  03/09/2020 9:57:22 AM > Pharm has been added      Thamas Jaegers , RN 03/09/2020 10:31:05 AM > Still cant change to new pharmacy.Marland KitchenBasilia Jumbo , RN 03/09/2020 11:23:29 AM > Dr Dareen Piano, are you able to send for refill?  Send message back when done so I can call patient.Marland Kitchenkmw      Mitch Arquette  03/09/2020 1:21:15 PM > all set!      Thamas Jaegers , RN 03/09/2020 1:52:28 PM > left message on voicemail that script was sent.Marland Kitchenkmw                Refills Refill Aviane Tablet, 0.1-20 MG-MCG, Orally, 28 Tablet, 1 tablet,  Once a day, 28 days, Refills=3    ------          * * *                ---          * * *         Provider: Carmelina Paddock 03/08/2020    ---    Note generated by eClinicalWorks EMR/PM Software ([email or web address])

## 2020-03-09 MED ORDER — Aviane: Tablet | Freq: Every day | 3 refills | 0 days | Status: AC

## 2020-04-06 ENCOUNTER — Ambulatory Visit: Admitting: Student in an Organized Health Care Education/Training Program

## 2020-04-06 MED ORDER — guaiFENesin: 200 | Tablet | 0 refills | 0 days | Status: AC

## 2020-04-06 MED ORDER — ProAir HFA: 90 | 1 | Freq: Four times a day (QID) | 0 refills | 0 days | Status: AC

## 2020-04-06 NOTE — Progress Notes (Signed)
* * *      Alison Hodges, Alison Hodges **DOB:** 11/10/2000 (19 yo F) **Acc No.** [id number] **DOS:**  04/06/2020    ---       [name]**    ------    26 Y old Female, DOB: Dec 14, 2000    [address], [place], [place] [address]    Home: [phone]    Provider: Carmelina Paddock, MD        * * *    Telephone Encounter    ---    Answered by  Alison Hodges Date: 04/06/2020       Time: 01:45 PM    Caller  Mardene Celeste [phone]    ------            Reason  refill            Message                     Patient calling asking for refill of albuterol inhaler.  Patient in [place] at school.  Patient recently dx with strep and put on abx by nurse at school.  Finished abx last thur or fri.  Coughing a lot and feels sob a lot of the time.  Patient was tetsed for covid as well and it was negative.  Denies fever, N/V/D.  Acid reflux has been a little exasperated from coughing and wiill vomitt sometime from coughing.  Also asking if you can prescribe a non narcotic cough med that might help as she has been doing robittussin and cough drops with no releif especially at night.                  Action Taken                     [place] , [place] 04/06/2020 1:50:30 PM >      Alison Hodges , MD 04/06/2020 2:34:53 PM > sent                Refills Refill ProAir HFA aerosol, 90 mcg/inh, inhaled, 1, 2 puff(s), 4 times  a day, 30 day(s), Refills=0    ------     Start guaiFENesin Tablet, 200 MG, Orally, 120 Tablet, 1 tablet as needed,  every 6 hrs, 30 days, Refills=0          * * *                ---          * * *         Provider: Carmelina Paddock, MD 04/06/2020    ---    Note generated by eClinicalWorks EMR/PM Software ([email or web address])

## 2020-04-30 ENCOUNTER — Other Ambulatory Visit: Payer: Self-pay

## 2020-05-04 ENCOUNTER — Ambulatory Visit (INDEPENDENT_AMBULATORY_CARE_PROVIDER_SITE_OTHER): Payer: BC Managed Care – PPO | Admitting: Gastroenterology

## 2020-05-04 ENCOUNTER — Encounter: Payer: Self-pay | Admitting: Gastroenterology

## 2020-05-04 ENCOUNTER — Encounter (INDEPENDENT_AMBULATORY_CARE_PROVIDER_SITE_OTHER): Payer: Self-pay

## 2020-05-04 ENCOUNTER — Other Ambulatory Visit: Payer: Self-pay

## 2020-05-04 VITALS — BP 124/73 | HR 102 | Temp 98.4°F | Ht 64.0 in | Wt 130.8 lb

## 2020-05-04 DIAGNOSIS — R109 Unspecified abdominal pain: Secondary | ICD-10-CM

## 2020-05-04 DIAGNOSIS — R197 Diarrhea, unspecified: Secondary | ICD-10-CM | POA: Diagnosis not present

## 2020-05-05 MED ORDER — OMEPRAZOLE 40 MG PO CPDR
40.0000 mg | DELAYED_RELEASE_CAPSULE | Freq: Two times a day (BID) | ORAL | 0 refills | Status: DC
Start: 2020-05-05 — End: 2021-10-25

## 2020-05-05 NOTE — Progress Notes (Signed)
[name] [address]  [address]  [place], [place] [address]  Main: [phone]  Fax: [phone]   Gastroenterology Consultation  Referring Provider:     Herma Mering, NP Primary Care Physician:  [facility] Reason for Consultation:     Nausea, vomiting, reflux        HPI:    Chief Complaint  Patient presents with  . Gastroesophageal Reflux    Patient has had acid reflux for several months. Patient has had nausea and vomiting.    Emma Silva is a 19 y.o. y/o female referred for consultation & management  by Dr. Eli Phillips, [facility].  Patient reports several year history, at least 3 to 4 years of what she calls "sensitive stomach".  States she is always nauseous.  Reports having loose bowel movements on a daily basis every time she eats.  Has tried to work on anxiety and stressors but states even when she is not anxious or stressed she continues to have the symptoms.  Episodes of emesis are infrequent, about once a month.  No hematemesis.  No blood in stool.  No weight loss.  No family history of colon cancer.  No prior EGD or colonoscopy.  States her mother also has a "sensitive stomach".  Past Medical History:  Diagnosis Date  . GERD (gastroesophageal reflux disease)     History reviewed. No pertinent surgical history.  Prior to Admission medications   Medication Sig Start Date End Date Taking? Authorizing Provider  levonorgestrel-ethinyl estradiol (LARISSIA) 0.1-20 MG-MCG tablet Take 1 tablet by mouth daily.   Yes [provider]  omeprazole (PRILOSEC) 40 MG capsule Take 40 mg by mouth daily.   Yes [provider]  Pseudoephedrine-guaiFENesin (CHEST CONGESTION RELIEF D PO) Take 1 tablet by mouth every 6 (six) hours as needed.   Yes [provider]    History reviewed. No pertinent family history.   Social History   Tobacco Use  . Smoking status: Never Smoker  . Smokeless tobacco: Never Used    Vaping Use  . Vaping Use: Never used  Substance Use Topics  . Alcohol use: Yes    Comment: socially  . Drug use: Not on file    Allergies as of 05/04/2020  . (No Known Allergies)    Review of Systems:    All systems reviewed and negative except where noted in HPI.   Physical Exam:  BP 124/73   Pulse (!) 102   Temp 98.4 F (36.9 C) (Oral)   Ht 5\' 4"  (1.626 m)   Wt 130 lb 12.8 oz (59.3 kg)   BMI 22.45 kg/m  No LMP recorded. (Menstrual status: Oral contraceptives). Psych:  Alert and cooperative. Normal mood and affect. General:   Alert,  Well-developed, well-nourished, pleasant and cooperative in NAD Head:  Normocephalic and atraumatic. Eyes:  Sclera clear, no icterus.   Conjunctiva pink. Ears:  Normal auditory acuity. Nose:  No deformity, discharge, or lesions. Mouth:  No deformity or lesions,oropharynx pink & moist. Neck:  Supple; no masses or thyromegaly. Abdomen:  Normal bowel sounds.  No bruits.  Soft, non-tender and non-distended without masses, hepatosplenomegaly or hernias noted.  No guarding or rebound tenderness.    Msk:  Symmetrical without gross deformities. Good, equal movement & strength bilaterally. Pulses:  Normal pulses noted. Extremities:  No clubbing or edema.  No cyanosis. Neurologic:  Alert and oriented x3;  grossly normal neurologically. Skin:  Intact without significant lesions or rashes. No  jaundice. Lymph Nodes:  No significant cervical adenopathy. Psych:  Alert and cooperative. Normal mood and affect.   Labs: Hemoglobin was normal in July 2021 on scanned referral notes  Imaging Studies: No results found.  Assessment and Plan:   Emma Silva is a 19 y.o. y/o female has been referred for nausea, vomiting, diarrhea chronically  Symptoms most likely functional in nature given her above history  She does not have any alarm symptoms  I will obtain basic labs at this time including inflammatory markers.  Depending on above results, can  determine further work-up and treatment  Obtain H. pylori serology as patient already on omeprazole.  Omeprazole has helped but patient continues to have symptoms.  Increased to twice daily dosing for 4 weeks  (Risks of PPI use were discussed with patient including bone loss, C. Diff diarrhea, pneumonia, infections, CKD, electrolyte abnormalities.  Pt. Verbalizes understanding and chooses to continue the medication.)     Dr [name]  Speech recognition software was used to dictate the above note.

## 2020-05-05 NOTE — Addendum Note (Signed)
Addended by: Adela Ports on: 05/05/2020 01:53 PM   Modules accepted: Orders

## 2020-05-10 ENCOUNTER — Ambulatory Visit (HOSPITAL_BASED_OUTPATIENT_CLINIC_OR_DEPARTMENT_OTHER): Admitting: Psychiatry

## 2020-05-10 ENCOUNTER — Ambulatory Visit: Admitting: Pediatrics

## 2020-05-10 NOTE — Progress Notes (Signed)
* * *      Alison Hodges, Alison Hodges **DOB:** 09/17/2000 (19 yo F) **Acc No.** [id number] **DOS:**  05/10/2020    ---        Page Spiro**    ------    36 Y old Female, DOB: 01/18/2001    [address], [place], [place] [address]    Home: [phone]    Provider: Jeani Hawking, NP        * * *    Telephone Encounter    ---    Answered by    Jeani Hawking    Date: 05/10/2020        Time: 08:52 AM    Reason    GI note    ------            Message                      Pt saw GI for GERD, to have labs and f/u 6 weeks.                Action Taken                      [name] , NP 05/10/2020 8:52:26 AM >                     * * *              * * *        ---        Reason for Appointment    ---      1\. 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 very mild intermittent. not recquirg Albuterol , 01/2016.  01/21/20 - unknown when last used, >25yrs ago.Marland Kitchen    ---    R radial buckle Fx--12/26/05, RESOLVED.    ---    vasovagal reaction with pain - syncope, urinary incontinence - 12/08, refer to  Dr. Durward Mallard 12/10/09, RESOLVED.    ---    went to [facility] for [facility]copy, 06/16/10. EKG, Lt axis deviation. Needs F up with  cardiology per ED MD. DW Cardiology MD at [facility] by ED Md. seen By cardiology,  normal vaso vagal, No f up, Neurology wnl.RESOLVED 01/2016.    ---    s shaped scoliosis - 10', RESOLVED 01/2016, 02/2017.    ---    Knee pain - 06/30/13 - osgood schlatter & mild chondromalacia. still ongoing lt  knee discomfort, Re refer to ORTHO , 01/2016, on going 02/2017.    ---    Father declined HPV vaccine 01/2016, 02/2017. declined meningitis vaccine  02/2017.    ---    refer to counselling for Anxiety 02/2017.    ---    Vasovagal episode.    ---    Vasovagal episode.    ---    12/02/19 - Right sided anterior cervical Lymphadenitis, tx with abx.    ---    GERD - 05/10/20 f'd by GI, f/u 6 weeks..    ---          * * *          Provider: Jeani Hawking, NP 05/10/2020    ---    Note  generated by eClinicalWorks EMR/PM Software ([email or web address])

## 2020-05-19 ENCOUNTER — Ambulatory Visit: Admitting: Pediatrics

## 2020-05-19 NOTE — Progress Notes (Signed)
* * *      Alison Hodges, Alison Hodges **DOB:** Aug 25, 2000 (19 yo F) **Acc No.** [id number] **DOS:**  05/19/2020    ---        Page Spiro**    ------    6 Y old Female, DOB: [birth year]    [address], [place], [place] [address]    Home: [phone]    Provider: Jeani Hawking, NP        * * *    Telephone Encounter    ---    Answered by    Ilean Skill    Date: 05/19/2020        Time: 04:04 PM    Caller    patient and mom    ------            Reason    fever, cough, swollen lymph nodes            Message                      Patient reports lymph nodes are swollen. Patient reports cough, congestion and fever X 1 week. Came home on Sunday from [place], was sick before she boarded plane. Patient asking to have an abx sent in instead of being seen. Advised patient and mom that we are unable to prescribe an abx without patient being seen, as patient may need to get swabbed for strep or covid, given patients symptoms. Mom asking that we look at patients previous notes from GI and ENT as patient has a hx of swollen lymph nodes and abx have helped in the past. Reminded mom that patient could have a virus or bacterial infection that we would need to test for and that the providers will not send an abx without the patient being seen. Mom unable to get to office in time today and will take patient to minute clinic.                 Action Taken                      [facility]  05/19/2020 4:04:45 PM > Advised mom to call back if she has any other questions. Mom thankful and expressed understanding.                     * * *                ---          * * *          Provider: Jeani Hawking, NP 05/19/2020    ---    Note generated by eClinicalWorks EMR/PM Software ([email or web address])

## 2020-06-01 ENCOUNTER — Ambulatory Visit: Admitting: Pediatrics

## 2020-06-01 ENCOUNTER — Ambulatory Visit: Admitting: Student in an Organized Health Care Education/Training Program

## 2020-06-01 LAB — COMPREHENSIVE METABOLIC PANEL
ALT: 23 IU/L (ref 0–32)
AST: 19 IU/L (ref 0–40)
Albumin/Globulin Ratio: 1.7 (ref 1.2–2.2)
Albumin: 4.8 g/dL (ref 3.9–5.0)
Alkaline Phosphatase: 78 IU/L (ref 42–106)
BUN/Creatinine Ratio: 11 (ref 9–23)
BUN: 10 mg/dL (ref 6–20)
Bilirubin Total: <0.2 mg/dL (ref 0.0–1.2)
CO2: 21 mmol/L (ref 20–29)
Calcium: 9.9 mg/dL (ref 8.7–10.2)
Chloride: 102 mmol/L (ref 96–106)
Creatinine, Ser: 0.87 mg/dL (ref 0.57–1.00)
GFR calc Af Amer: 111 mL/min/{1.73_m2} (ref >59)
GFR calc non Af Amer: 96 mL/min/{1.73_m2} (ref >59)
Globulin, Total: 2.9 g/dL (ref 1.5–4.5)
Glucose: 87 mg/dL (ref 65–99)
Potassium: 4.7 mmol/L (ref 3.5–5.2)
Sodium: 141 mmol/L (ref 134–144)
Total Protein: 7.7 g/dL (ref 6.0–8.5)

## 2020-06-01 LAB — CBC
Hematocrit: 40.2 % (ref 34.0–46.6)
Hemoglobin: 13.5 g/dL (ref 11.1–15.9)
MCH: 29 pg (ref 26.6–33.0)
MCHC: 33.6 g/dL (ref 31.5–35.7)
MCV: 87 fL (ref 79–97)
Platelets: 415 10*3/uL (ref 150–450)
RBC: 4.65 x10E6/uL (ref 3.77–5.28)
RDW: 13 % (ref 11.7–15.4)
WBC: 8.7 10*3/uL (ref 3.4–10.8)

## 2020-06-01 LAB — H PYLORI, IGM, IGG, IGA AB
H pylori, IgM Abs: <9 units (ref 0.0–8.9)
H. pylori, IgA Abs: 12.1 units — ABNORMAL HIGH (ref 0.0–8.9)
H. pylori, IgG AbS: 0.26 Index Value (ref 0.00–0.79)

## 2020-06-01 LAB — TISSUE TRANSGLUTAMINASE ABS,IGG,IGA
Tissue Transglut Ab: <2 U/mL (ref 0–5)
Transglutaminase IgA: 5 U/mL — ABNORMAL HIGH (ref 0–3)

## 2020-06-01 LAB — CALPROTECTIN, FECAL: Calprotectin, Fecal: 138 ug/g — ABNORMAL HIGH (ref 0–120)

## 2020-06-01 LAB — C-REACTIVE PROTEIN: CRP: 5 mg/L (ref 0–10)

## 2020-06-01 MED ORDER — Aviane: 90 | Freq: Every day | 1 refills | 0 days | Status: AC

## 2020-06-01 NOTE — Progress Notes (Signed)
* * *      Alison Hodges, Alison Hodges **DOB:** Nov 21, 2000 (19 yo F) **Acc No.** [id number] **DOS:**  06/01/2020    ---        Page Spiro**    ------    15 Y old Female, DOB: April 12, 2001    [address] [place], [place] [address]    Home: [phone]    Provider: Jeani Hawking, NP        * * *    Telephone Encounter    ---    Answered by    Ilean Skill    Date: 06/01/2020        Time: 02:51 PM    Caller    patient    ------            Reason    swollen lymph nodes            Message                      Spoke with patient. Patient reporting that she continues to have swollen lymph nodes. Had same issue back in June. Patient reports having a fever 2 weeks ago. Tested for Covid last week and was negative. Denies difficulty breathing. Pain in jaw and neck. Swelling noted. Redness and slight warmth noted intermittently throughout the day. Patient went to campus MD today and was told to take ibuprofen and aleve. Patient had labs done on Friday with campus MD and was told she was positive for mono. Patient looking to schedule follow-up.                Action Taken                      [facility]  06/01/2020 2:58:15 PM > Per Caitlin swollen lymph nodes can last a long time after a viral illness. Patient to continue to monitor. Advised to go to urgent care or ER if symptoms get worse. Asked to have patient fax over records from campus MD. Patient to call when she is back in town if symptoms persist and schedule appointment.                     * * *                ---          * * *          Provider: Jeani Hawking, NP 06/01/2020    ---    Note generated by eClinicalWorks EMR/PM Software ([email or web address])

## 2020-06-01 NOTE — Progress Notes (Signed)
* * *      Henkels, Kalista **DOB:** [birth year] (19 yo F) **Acc No.** [id number] **DOS:**  06/01/2020    ---        Page Spiro**    ------    36 Y old Female, DOB: [birth year]    [address], [place], [place] [address]    Home: [phone]    Provider: Carmelina Paddock, MD        * * *    Telephone Encounter    ---    Answered by    Carmelina Paddock    Date: 06/01/2020        Time: 11:46 AM    Message                      Needs 90 day supply of birth control        ------            Action Taken                      Analia Zuk , MD 06/01/2020 11:46:19 AM >                 Refills    Refill Aviane Tablet, 0.1-20 MG-MCG, Orally, 90, 1 tablet, Once a  day, 90 days, Refills=1    ------          * * *                ---          * * *          Provider: Carmelina Paddock, MD 06/01/2020    ---    Note generated by eClinicalWorks EMR/PM Software ([email or web address])

## 2020-06-02 ENCOUNTER — Other Ambulatory Visit: Payer: Self-pay

## 2020-06-02 ENCOUNTER — Telehealth: Payer: Self-pay

## 2020-06-02 ENCOUNTER — Ambulatory Visit: Admitting: Pediatrics

## 2020-06-02 DIAGNOSIS — R109 Unspecified abdominal pain: Secondary | ICD-10-CM

## 2020-06-02 DIAGNOSIS — Z1211 Encounter for screening for malignant neoplasm of colon: Secondary | ICD-10-CM

## 2020-06-02 MED ORDER — CLENPIQ 10-3.5-12 MG-GM -GM/160ML PO SOLN: ORAL | 0 refills | Status: DC

## 2020-06-02 NOTE — Telephone Encounter (Signed)
Pasty Spillers, MD  Adela Ports, CMA Kaileah Shevchenko please let the patient know, her fecal calprotectin is also elevated and I would recommend a colonoscopy as well.

## 2020-06-02 NOTE — Telephone Encounter (Signed)
Patient called back and I was able to let her know about her lab results and Dr. Michele Mcalpine recommendations. Patient agreed on doing an [facility]. Patient was given instructions and was also told that I would mail them to her as well. Per patient's request, she wanted me to mail her the instructions at her home address since she was leaving tomorrow for the holidays to: [address], [place], [place] [address]. Patient had no further questions at this time.

## 2020-06-02 NOTE — Telephone Encounter (Signed)
Called patient and left her a voicemail letting her know to please call me back for lab results.

## 2020-06-02 NOTE — Addendum Note (Signed)
Addended by: Adela Ports on: 06/02/2020 11:34 AM   Modules accepted: Orders

## 2020-06-02 NOTE — Telephone Encounter (Signed)
-----   Message from Pasty Spillers, MD sent at 06/01/2020 12:17 PM EST ----- Emma Silva please let the patient know, her blood work shows weakly positive TTG IgA. This might mean she may have celiac disease. Given her symptoms and weakly positive blood test I would recommend an Upper endoscopy as that would allow [place] to evaluate with biopsies of the small bowel.  We would also obtain biopsies of the stomach as her blood work also shows possibility of H. pylori.  She does not need to start eating a gluten-free diet at this time.  I would encourage her to keep eating gluten or wheat products at this time and proceed with upper endoscopy first.

## 2020-06-02 NOTE — Telephone Encounter (Signed)
Called patient and let her know what Dr. Maximino Greenland stated below. Patient agreed on getting the colonoscopy done on the same day. Patient understood and had no further questions at this time.

## 2020-06-02 NOTE — Progress Notes (Signed)
* * *      Alison Hodges, Alison Hodges **DOB:** [birth year] (19 yo F) **Acc No.** [id number] **DOS:**  06/02/2020    ---        Page Spiro**    ------    48 Y old Female, DOB: 2001/02/18    [address], [place], [place] [address]    Home: [phone]    Provider: Jeani Hawking, NP        * * *    Telephone Encounter    ---    Answered by    Jeani Hawking    Date: 06/02/2020        Time: 02:11 PM    Reason    Records review, need for ENT f/u    ------            Message                      [facility] - records show a past hx of mono and strep.  Seen recently and refered to ENT for persistant swollen glands.  See previous telephone encounter, [facility] recommends ENT f/u for these glands - and per hx pt has seen ENT in the past.  Please refer pt to her ENT for f/u.                Action Taken                      Sheldon Sem , NP 06/02/2020 2:12:30 PM >      [name],[name]  06/02/2020 2:53:33 PM > Spoke to patient and advised to contact ENT for follow-up. Patient thankful and expressed understanding.                     * * *                ---          * * *          Provider: Jeani Hawking, NP 06/02/2020    ---    Note generated by eClinicalWorks EMR/PM Software ([email or web address])

## 2020-06-06 ENCOUNTER — Other Ambulatory Visit: Payer: Self-pay | Admitting: Gastroenterology

## 2020-06-07 ENCOUNTER — Telehealth: Payer: Self-pay

## 2020-06-07 NOTE — Telephone Encounter (Signed)
I had called patient back and I had to leave her a voicemail to call me back.

## 2020-06-07 NOTE — Telephone Encounter (Signed)
Patient called wanting to know how I could help her schedule a procedure at her hometown. Patient is currently on winter break and would like to schedule a procedure near her home at [place]. I then called her back since she had left me that voicemail but I had to leave her a voicemail to return my call. I also saw that she had called to cancel her procedure here with Dr. Maximino Greenland for 07/01/2020.

## 2020-06-08 ENCOUNTER — Telehealth (INDEPENDENT_AMBULATORY_CARE_PROVIDER_SITE_OTHER): Payer: BC Managed Care – PPO | Admitting: Gastroenterology

## 2020-06-08 ENCOUNTER — Other Ambulatory Visit: Payer: Self-pay

## 2020-06-08 ENCOUNTER — Encounter: Payer: Self-pay | Admitting: Gastroenterology

## 2020-06-08 DIAGNOSIS — A048 Other specified bacterial intestinal infections: Secondary | ICD-10-CM | POA: Diagnosis not present

## 2020-06-08 DIAGNOSIS — K900 Celiac disease: Secondary | ICD-10-CM | POA: Diagnosis not present

## 2020-06-08 DIAGNOSIS — R195 Other fecal abnormalities: Secondary | ICD-10-CM

## 2020-06-08 MED ORDER — PEG 3350-KCL-NA BICARB-NACL 420 G PO SOLR: ORAL | 0 refills | Status: DC

## 2020-06-08 NOTE — Progress Notes (Signed)
[name], MD [address]  [address]  [place], [place] [address]  Main: [phone]  Fax: [phone]   Primary Care Physician: [facility]  Virtual Visit via Telephone Note  I connected with patient on 06/08/20 at  1:30 PM EST by telephone and verified that I am speaking with the correct person using two identifiers.   I discussed the limitations, risks, security and privacy concerns of performing an evaluation and management service by telephone and the availability of in person appointments. I also discussed with the patient that there may be a patient responsible charge related to this service. The patient expressed understanding and agreed to proceed.  Location of Patient: Home Location of Provider: Home Persons involved: Patient and provider only during the visit (nursing staff and front desk staff was involved in communicating with the patient prior to the appointment, reviewing medications and checking them in)   History of Present Illness: Chief Complaint  Patient presents with  . Abdominal Pain     HPI: Emma Silva is a 19 y.o. female here for follow-up for nausea vomiting diarrhea.  Since last visit her blood work shows weakly positive TTG IgA at 5 with negative being 0-3.  Her fecal calprotectin is also positive at 138 with abnormal being above 120, and borderline positive being between 50-120.  H. pylori IgA was positive and IgG and IgM were negative.  Patient denies straining with stools, but has started to notice light bright red spotting once or twice since last visit.  No fever chills.  Current Outpatient Medications  Medication Sig Dispense Refill  . levonorgestrel-ethinyl estradiol (ALESSE) 0.1-20 MG-MCG tablet Take 1 tablet by mouth daily.    Marland Kitchen omeprazole (PRILOSEC) 40 MG capsule Take 1 capsule (40 mg total) by mouth in the morning and at bedtime. 56 capsule 0  . Pseudoephedrine-guaiFENesin (CHEST CONGESTION RELIEF D PO)  Take 1 tablet by mouth every 6 (six) hours as needed. (Patient not taking: Reported on 06/08/2020)     No current facility-administered medications for this visit.    Allergies as of 06/08/2020  . (No Known Allergies)    Review of Systems:    All systems reviewed and negative except where noted in HPI.   Observations/Objective:  Labs: CMP     Component Value Date/Time   NA 141 05/28/2020 1132   K 4.7 05/28/2020 1132   CL 102 05/28/2020 1132   CO2 21 05/28/2020 1132   GLUCOSE 87 05/28/2020 1132   BUN 10 05/28/2020 1132   CREATININE 0.87 05/28/2020 1132   CALCIUM 9.9 05/28/2020 1132   PROT 7.7 05/28/2020 1132   ALBUMIN 4.8 05/28/2020 1132   AST 19 05/28/2020 1132   ALT 23 05/28/2020 1132   ALKPHOS 78 05/28/2020 1132   BILITOT <0.2 05/28/2020 1132   GFRNONAA 96 05/28/2020 1132   GFRAA 111 05/28/2020 1132   Lab Results  Component Value Date   WBC 8.7 05/28/2020   HGB 13.5 05/28/2020   HCT 40.2 05/28/2020   MCV 87 05/28/2020   PLT 415 05/28/2020    Imaging Studies: No results found.  Assessment and Plan:   Emma Silva is a 19 y.o. y/o female here for follow-up of nausea vomiting diarrhea  Assessment and Plan: Given abnormal results, EGD and colonoscopy recommended for evaluation of celiac disease, biopsies for H. pylori to confirm if positive TTG IgA is from current infection.  Colonoscopy to evaluate for IBD given positive fecal calprotectin  We have scheduled  this for January 6 based on patient availability as she is currently out of town and will return that week.  However, she lives in [place] and has requested a referral to a GI there to see if she would be able to establish care with someone there sooner than the above scheduled procedure  We have placed a referral but I have also informed her that they may not be able to schedule her procedure earlier than that.  If they are able to, I have asked her to call us back and let us know and it would be best  for her to have 1 established GI provider unless needed by complicated disease or other reasons.  However, due to her traveling and for any other reasons if she is unable to get her upper endoscopy done within the next 4 to 6 weeks I have asked her to call us back as it would be best for [place] to start treatment for H. pylori if endoscopy would need to be delayed due to her traveling her living out of town and she verbalized understanding of the same.  If symptoms worsen before next appointment patient advised to let us know and she verbalized understanding  She had several questions which were answered in detail to her satisfaction  Follow Up Instructions:    I discussed the assessment and treatment plan with the patient. The patient was provided an opportunity to ask questions and all were answered. The patient agreed with the plan and demonstrated an understanding of the instructions.   The patient was advised to call back or seek an in-person evaluation if the symptoms worsen or if the condition fails to improve as anticipated.  I provided 16 minutes of non-face-to-face time during this encounter. Additional time was spent in reviewing patient's chart, placing orders etc.   Pasty Spillers, MD  Speech recognition software was used to dictate this note.

## 2020-06-08 NOTE — Addendum Note (Signed)
Addended by: Adela Ports on: 06/08/2020 03:39 PM   Modules accepted: Orders

## 2020-06-11 NOTE — Telephone Encounter (Signed)
Emma Silva called back and we were able to communicate with each other on what she wanted me to do for her. A referral was sent to a GI office per patient's request. I then told her that once the referral was sent, then she should contact their office and follow up with them while she was home in [place]. Patient agreed and had no further questions.

## 2020-06-29 ENCOUNTER — Telehealth: Payer: Self-pay

## 2020-06-29 ENCOUNTER — Other Ambulatory Visit
Admission: RE | Admit: 2020-06-29 | Discharge: 2020-06-29 | Disposition: A | Discharge status: Home / Self Care | Payer: BC Managed Care – PPO | Source: Ambulatory Visit | Attending: Gastroenterology | Admitting: Gastroenterology

## 2020-06-29 ENCOUNTER — Other Ambulatory Visit: Payer: Self-pay

## 2020-06-29 DIAGNOSIS — Z793 Long term (current) use of hormonal contraceptives: Secondary | ICD-10-CM | POA: Diagnosis not present

## 2020-06-29 DIAGNOSIS — R112 Nausea with vomiting, unspecified: Secondary | ICD-10-CM | POA: Diagnosis present

## 2020-06-29 DIAGNOSIS — Z79899 Other long term (current) drug therapy: Secondary | ICD-10-CM | POA: Diagnosis not present

## 2020-06-29 DIAGNOSIS — R197 Diarrhea, unspecified: Secondary | ICD-10-CM | POA: Diagnosis present

## 2020-06-29 DIAGNOSIS — Z20822 Contact with and (suspected) exposure to covid-19: Secondary | ICD-10-CM | POA: Diagnosis not present

## 2020-06-29 DIAGNOSIS — K2950 Unspecified chronic gastritis without bleeding: Secondary | ICD-10-CM | POA: Diagnosis not present

## 2020-06-29 LAB — SARS CORONAVIRUS 2 (TAT 6-24 HRS): SARS Coronavirus 2: NEGATIVE

## 2020-06-29 NOTE — Telephone Encounter (Signed)
Patient called stating that she had some questions about her medication for her procedure and would like a call back. I then called patient back and had to leave her a message to return my call.

## 2020-06-30 ENCOUNTER — Encounter: Payer: Self-pay | Admitting: Gastroenterology

## 2020-06-30 NOTE — Telephone Encounter (Signed)
Patient states that she thinks she may not be able to tolerate her bowel prep.  I advised that I could ask Dr. Maximino Greenland to send in a rx for phenergan or zofran to take prior to starting her Nulytely bowel prep.  She stated that she has Zofran already.  I advised that she can take the Zofran 1 hour before she starts to drink her Nulytely bowel prep and if she experiences nausea as she continues her prep she may take another zofran 4 hours from the time of the first dose.  Thanks,  [place], [place]

## 2020-07-01 ENCOUNTER — Encounter
Admission: RE | Disposition: A | Discharge status: Home / Self Care | Payer: Self-pay | Source: Home / Self Care | Attending: Gastroenterology

## 2020-07-01 ENCOUNTER — Ambulatory Visit: Payer: BC Managed Care – PPO | Admitting: Anesthesiology

## 2020-07-01 ENCOUNTER — Encounter: Payer: Self-pay | Admitting: Gastroenterology

## 2020-07-01 ENCOUNTER — Ambulatory Visit
Admission: RE | Admit: 2020-07-01 | Discharge: 2020-07-01 | Disposition: A | Discharge status: Home / Self Care | Payer: BC Managed Care – PPO | Attending: Gastroenterology | Admitting: Gastroenterology

## 2020-07-01 DIAGNOSIS — Z79899 Other long term (current) drug therapy: Secondary | ICD-10-CM | POA: Insufficient documentation

## 2020-07-01 DIAGNOSIS — Z20822 Contact with and (suspected) exposure to covid-19: Secondary | ICD-10-CM | POA: Insufficient documentation

## 2020-07-01 DIAGNOSIS — R109 Unspecified abdominal pain: Secondary | ICD-10-CM

## 2020-07-01 DIAGNOSIS — R197 Diarrhea, unspecified: Secondary | ICD-10-CM

## 2020-07-01 DIAGNOSIS — Z793 Long term (current) use of hormonal contraceptives: Secondary | ICD-10-CM | POA: Insufficient documentation

## 2020-07-01 DIAGNOSIS — K2950 Unspecified chronic gastritis without bleeding: Secondary | ICD-10-CM | POA: Insufficient documentation

## 2020-07-01 HISTORY — PX: ESOPHAGOGASTRODUODENOSCOPY: SHX5428

## 2020-07-01 HISTORY — PX: COLONOSCOPY WITH PROPOFOL: SHX5780

## 2020-07-01 LAB — POCT PREGNANCY, URINE: Preg Test, Ur: NEGATIVE

## 2020-07-01 SURGERY — COLONOSCOPY WITH PROPOFOL
Anesthesia: General

## 2020-07-01 SURGERY — EGD (ESOPHAGOGASTRODUODENOSCOPY)
Anesthesia: General

## 2020-07-01 MED ORDER — SODIUM CHLORIDE 0.9 % IV SOLN: INTRAVENOUS | Status: DC

## 2020-07-01 MED ORDER — MIDAZOLAM HCL 2 MG/ML PO SYRP
10.0000 mg | ORAL_SOLUTION | Freq: Once | ORAL | Status: AC
  Administered 2020-07-01: 10 mg via ORAL

## 2020-07-01 MED ORDER — PROPOFOL 10 MG/ML IV BOLUS
INTRAVENOUS | Status: AC
  Filled 2020-07-01: qty 20

## 2020-07-01 MED ORDER — PROPOFOL 500 MG/50ML IV EMUL
INTRAVENOUS | Status: DC | PRN
  Administered 2020-07-01: 125 ug/kg/min via INTRAVENOUS

## 2020-07-01 MED ORDER — DEXMEDETOMIDINE (PRECEDEX) IN NS 20 MCG/5ML (4 MCG/ML) IV SYRINGE
PREFILLED_SYRINGE | INTRAVENOUS | Status: DC | PRN
  Administered 2020-07-01 (×3): 4 ug via INTRAVENOUS

## 2020-07-01 MED ORDER — PHENYLEPHRINE HCL (PRESSORS) 10 MG/ML IV SOLN
INTRAVENOUS | Status: DC | PRN
  Administered 2020-07-01: 100 ug via INTRAVENOUS

## 2020-07-01 MED ORDER — LIDOCAINE HCL (CARDIAC) PF 100 MG/5ML IV SOSY
PREFILLED_SYRINGE | INTRAVENOUS | Status: DC | PRN
  Administered 2020-07-01: 50 mg via INTRAVENOUS

## 2020-07-01 MED ORDER — PROPOFOL 10 MG/ML IV BOLUS
INTRAVENOUS | Status: DC | PRN
  Administered 2020-07-01: 30 mg via INTRAVENOUS
  Administered 2020-07-01 (×2): 50 mg via INTRAVENOUS

## 2020-07-01 MED ORDER — LIDOCAINE HCL (PF) 2 % IJ SOLN
INTRAMUSCULAR | Status: AC
  Filled 2020-07-01: qty 5

## 2020-07-01 MED ORDER — MIDAZOLAM HCL 2 MG/ML PO SYRP
ORAL_SOLUTION | ORAL | Status: AC
  Filled 2020-07-01: qty 5

## 2020-07-01 MED ORDER — PROPOFOL 500 MG/50ML IV EMUL
INTRAVENOUS | Status: AC
  Filled 2020-07-01: qty 50

## 2020-07-01 NOTE — Anesthesia Procedure Notes (Signed)
Procedure Name: MAC Date/Time: 07/01/2020 12:13 PM Performed by: Jerrye Noble, CRNA Pre-anesthesia Checklist: Patient identified, Emergency Drugs available, Patient being monitored and Suction available Patient Re-evaluated:Patient Re-evaluated prior to induction Oxygen Delivery Method: Nasal cannula

## 2020-07-01 NOTE — Op Note (Signed)
[facility]astroenterology Patient Name: Emma Silva Procedure Date: 07/01/2020 11:56 AM MRN: [id number] Account #: [id number] Date of Birth: Jan 08, 2001 Admit Type: Outpatient Age: 20 Room: [facility] ENDO ROOM 2 Gender: Female Note Status: Finalized Procedure:             Colonoscopy Indications:           Clinically significant diarrhea of unexplained origin,                         Exclusion of ulcerative colitis Providers:             Hannahgrace Lalli B. Bonna Gains MD, MD Medicines:             Monitored Anesthesia Care Complications:         No immediate complications. Procedure:             Pre-Anesthesia Assessment:                        - Prior to the procedure, a History and Physical was                         performed, and patient medications, allergies and                         sensitivities were reviewed. The patient's tolerance                         of previous anesthesia was reviewed.                        - The risks and benefits of the procedure and the                         sedation options and risks were discussed with the                         patient. All questions were answered and informed                         consent was obtained.                        - Patient identification and proposed procedure were                         verified prior to the procedure by the physician, the                         nurse, the anesthetist and the technician. The                         procedure was verified in the pre-procedure area in                         the procedure room in the endoscopy suite.                        - ASA Grade Assessment: II - A patient with mild  systemic disease.                        - After reviewing the risks and benefits, the patient                         was deemed in satisfactory condition to undergo the                         procedure.                        After obtaining informed  consent, the colonoscope was                         passed under direct vision. Throughout the procedure,                         the patient's blood pressure, pulse, and oxygen                         saturations were monitored continuously. The                         Colonoscope was introduced through the anus and                         advanced to the the terminal ileum. The colonoscopy                         was performed with ease. The patient tolerated the                         procedure well. The quality of the bowel preparation                         was good. Findings:      The perianal and digital rectal examinations were normal.      The rectum, sigmoid colon, descending colon, transverse colon, ascending       colon, cecum and ileum appeared normal. Biopsies for histology were       taken with a cold forceps from the entire colon for evaluation of       microscopic colitis.      Retroflexion in the rectum was not performed due to Narrow rectum.       Careful frontal view of the rectum was otherwise normal. Impression:            - The rectum, sigmoid colon, descending colon,                         transverse colon, ascending colon, cecum and terminal                         ileum are normal. Biopsied. Recommendation:        - Await pathology results.                        - Discharge patient to home.                        -  Resume previous diet.                        - Continue present medications.                        - Return to primary care physician as previously                         scheduled.                        - The findings and recommendations were discussed with                         the patient.                        - The findings and recommendations were discussed with                         the patient's family. Procedure Code(s):     --- Professional ---                        [id number], Colonoscopy, flexible; with biopsy, single or                          multiple Diagnosis Code(s):     --- Professional ---                        R19.7, Diarrhea, unspecified CPT copyright 2019 American Medical Association. All rights reserved. The codes documented in this report are preliminary and upon coder review may  be revised to meet current compliance requirements.  [name], MD Michel Bickers B. Maximino Greenland MD, MD 07/01/2020 12:49:53 PM This report has been signed electronically. Number of Addenda: 0 Note Initiated On: 07/01/2020 11:56 AM Scope Withdrawal Time: 0 hours 12 minutes 14 seconds  Total Procedure Duration: 0 hours 17 minutes 27 seconds  Estimated Blood Loss:  Estimated blood loss: none.      [facility]

## 2020-07-01 NOTE — Anesthesia Preprocedure Evaluation (Signed)
Anesthesia Evaluation  Patient identified by MRN, date of birth, ID band Patient awake    Reviewed: Allergy & Precautions, H&P , NPO status , Patient's Chart, lab work & pertinent test results  History of Anesthesia Complications Negative for: history of anesthetic complications  Airway Mallampati: II  TM Distance: >3 FB Neck ROM: full    Dental  (+) Chipped   Pulmonary neg shortness of breath, asthma ,    Pulmonary exam normal        Cardiovascular Exercise Tolerance: Good (-) angina(-) Past MI negative cardio ROS Normal cardiovascular exam     Neuro/Psych PSYCHIATRIC DISORDERS Anxiety negative neurological ROS  negative psych ROS   GI/Hepatic Neg liver ROS, GERD  Medicated and Controlled,  Endo/Other  negative endocrine ROS  Renal/GU negative Renal ROS  negative genitourinary   Musculoskeletal   Abdominal   Peds  Hematology negative hematology ROS (+)   Anesthesia Other Findings Past Medical History: No date: GERD (gastroesophageal reflux disease)  Past Surgical History: No date: KNEE ARTHROSCOPY  BMI    Body Mass Index: 23.78 kg/m      Reproductive/Obstetrics negative OB ROS                             Anesthesia Physical Anesthesia Plan  ASA: III  Anesthesia Plan: General   Post-op Pain Management:    Induction: Intravenous  PONV Risk Score and Plan: Propofol infusion and TIVA  Airway Management Planned: Natural Airway and Nasal Cannula  Additional Equipment:   Intra-op Plan:   Post-operative Plan:   Informed Consent: I have reviewed the patients History and Physical, chart, labs and discussed the procedure including the risks, benefits and alternatives for the proposed anesthesia with the patient or authorized representative who has indicated his/her understanding and acceptance.     Dental Advisory Given  Plan Discussed with: Anesthesiologist, CRNA and  Surgeon  Anesthesia Plan Comments: (Patient is exteremly anxious about IV placement, plan for Oral PreMed with midazolam once she is fully consented by both teams before IV placement  Patient consented for risks of anesthesia including but not limited to:  - adverse reactions to medications - risk of airway placement if required - damage to eyes, teeth, lips or other oral mucosa - nerve damage due to positioning  - sore throat or hoarseness - Damage to heart, brain, nerves, lungs, other parts of body or loss of life  Patient voiced understanding.)        Anesthesia Quick Evaluation

## 2020-07-01 NOTE — Op Note (Signed)
[facility] Gastroenterology Patient Name: Emma Silva Procedure Date: 07/01/2020 11:58 AM MRN: [id number] Account #: [id number] Date of Birth: [birth year] Admit Type: Outpatient Age: 20 Room: [facility] ENDO ROOM 2 Gender: Female Note Status: Finalized Procedure:             Upper GI endoscopy Indications:           Exclusion of Helicobacter pylori, Exclusion of celiac                         disease, Diarrhea, Nausea with vomiting Providers:             Nicolette Gieske B. Bonna Gains MD, MD Medicines:             Monitored Anesthesia Care Complications:         No immediate complications. Procedure:             Pre-Anesthesia Assessment:                        - Prior to the procedure, a History and Physical was                         performed, and patient medications, allergies and                         sensitivities were reviewed. The patient's tolerance                         of previous anesthesia was reviewed.                        - The risks and benefits of the procedure and the                         sedation options and risks were discussed with the                         patient. All questions were answered and informed                         consent was obtained.                        - Patient identification and proposed procedure were                         verified prior to the procedure by the physician, the                         nurse, the anesthesiologist, the anesthetist and the                         technician. The procedure was verified in the                         procedure room.                        - ASA Grade Assessment: II - A patient with mild  systemic disease.                        After obtaining informed consent, the endoscope was                         passed under direct vision. Throughout the procedure,                         the patient's blood pressure, pulse, and oxygen                          saturations were monitored continuously. The Endoscope                         was introduced through the mouth, and advanced to the                         second part of duodenum. The upper GI endoscopy was                         accomplished with ease. The patient tolerated the                         procedure well. Findings:      The examined esophagus was normal.      The entire examined stomach was normal. Biopsies were obtained in the       gastric body, at the incisura and in the gastric antrum with cold       forceps for histology. Biopsies were taken with a cold forceps for       Helicobacter pylori testing.      The duodenal bulb, second portion of the duodenum and examined duodenum       were normal. Biopsies were obtained in the duodenal bulb and in the       second portion of the duodenum with cold forceps for evaluation of       celiac disease. Impression:            - Normal esophagus.                        - Normal stomach. Biopsied.                        - Normal duodenal bulb, second portion of the duodenum                         and examined duodenum.                        - Biopsies were obtained in the gastric body, at the                         incisura and in the gastric antrum.                        - Biopsies were obtained in the duodenal bulb and in                         the  second portion of the duodenum. Recommendation:        - Await pathology results.                        - Discharge patient to home (with escort).                        - Advance diet as tolerated.                        - Continue present medications.                        - Patient has a contact number available for                         emergencies. The signs and symptoms of potential                         delayed complications were discussed with the patient.                         Return to normal activities tomorrow. Written                         discharge  instructions were provided to the patient.                        - Discharge patient to home (with escort).                        - The findings and recommendations were discussed with                         the patient.                        - The findings and recommendations were discussed with                         the patient's family. Procedure Code(s):     --- Professional ---                        [phone], Esophagogastroduodenoscopy, flexible,                         transoral; with biopsy, single or multiple Diagnosis Code(s):     --- Professional ---                        R19.7, Diarrhea, unspecified                        R11.2, Nausea with vomiting, unspecified CPT copyright 2019 American Medical Association. All rights reserved. The codes documented in this report are preliminary and upon coder review may  be revised to meet current compliance requirements.  [name], MD Michel Bickers B. Shanon Seawright MD, MD 07/01/2020 12:26:09 PM This report has been signed electronically. Number of Addenda: 0 Note Initiated On: 07/01/2020 11:58 AM Estimated Blood Loss:  Estimated blood loss: none.  [facility]

## 2020-07-01 NOTE — H&P (Signed)
[name], MD [address], [place], [place], [address] [address], [place], [place], [address] Phone: [phone]  Fax: [phone]  Primary Care Physician:  [facility], [facility]   Pre-Procedure History & Physical: HPI:  Emma Silva is a 20 y.o. female is here for a colonoscopy and EGD.   Past Medical History:  Diagnosis Date  . GERD (gastroesophageal reflux disease)     Past Surgical History:  Procedure Laterality Date  . KNEE ARTHROSCOPY      Prior to Admission medications   Medication Sig Start Date End Date Taking? Authorizing Provider  ondansetron (ZOFRAN) 4 MG tablet Take 4 mg by mouth every 8 (eight) hours as needed for nausea or vomiting.   Yes [provider]  levonorgestrel-ethinyl estradiol (ALESSE) 0.1-20 MG-MCG tablet Take 1 tablet by mouth daily.    [provider]  omeprazole (PRILOSEC) 40 MG capsule Take 1 capsule (40 mg total) by mouth in the morning and at bedtime. 05/05/20   Pasty Spillers, MD  polyethylene glycol-electrolytes (NULYTELY) 420 g solution Prepare according to package instructions. Starting at 5:00 PM: Drink one 8 oz glass of mixture every 15 minutes until you finish half of the jug. Five hours prior to procedure, drink 8 oz glass of mixture every 15 minutes until it is all gone. Make sure you do not drink anything 4 hours prior to your procedure. 06/08/20   Pasty Spillers, MD  Pseudoephedrine-guaiFENesin (CHEST CONGESTION RELIEF D PO) Take 1 tablet by mouth every 6 (six) hours as needed. Patient not taking: Reported on 06/08/2020    [provider]    Allergies as of 06/02/2020  . (No Known Allergies)    History reviewed. No pertinent family history.  Social History   Socioeconomic History  . Marital status: Single    Spouse name: Not on file  . Number of children: Not on file  . Years of education: Not on file  . Highest education level: Not on file   Occupational History  . Not on file  Tobacco Use  . Smoking status: Never Smoker  . Smokeless tobacco: Never Used  Vaping Use  . Vaping Use: Never used  Substance and Sexual Activity  . Alcohol use: Yes    Comment: socially  . Drug use: Never  . Sexual activity: Not on file  Other Topics Concern  . Not on file  Social History Narrative  . Not on file   Social Determinants of Health   Financial Resource Strain: Not on file  Food Insecurity: Not on file  Transportation Needs: Not on file  Physical Activity: Not on file  Stress: Not on file  Social Connections: Not on file  Intimate Partner Violence: Not on file    Review of Systems: See HPI, otherwise negative ROS  Physical Exam: BP 115/88   Pulse (!) 111   Temp (!) 97.5 F (36.4 C) (Temporal)   Resp 18   Ht 5\' 2"  (1.575 m)   Wt 59 kg   SpO2 100%   BMI 23.78 kg/m  General:   Alert,  pleasant and cooperative in NAD Head:  Normocephalic and atraumatic. Neck:  Supple; no masses or thyromegaly. Lungs:  Clear throughout to auscultation, normal respiratory effort.    Heart:  +S1, +S2, Regular rate and rhythm, No edema. Abdomen:  Soft, nontender and nondistended. Normal bowel sounds, without guarding, and without rebound.   Neurologic:  Alert and  oriented x4;  grossly normal neurologically.  Impression/Plan: Larah Kuntzman is here for a colonoscopy and EGD for nausea, vomiting, diarrhea, positive TTG IgA, evaluation for celiac disease, evaluation for H Pylori, evaluation for IBD   Risks, benefits, limitations, and alternatives regarding the procedures have been reviewed with the patient.  Questions have been answered.  All parties agreeable.   Pasty Spillers, MD  07/01/2020, 10:45 AM

## 2020-07-01 NOTE — Transfer of Care (Signed)
Immediate Anesthesia Transfer of Care Note  Patient: Emma Silva  Procedure(s) Performed: ESOPHAGOGASTRODUODENOSCOPY (EGD) (N/A ) COLONOSCOPY WITH PROPOFOL (N/A )  Patient Location: PACU and Endoscopy Unit  Anesthesia Type:General  Level of Consciousness: drowsy  Airway & Oxygen Therapy: Patient Spontanous Breathing  Post-op Assessment: Report given to RN and Post -op Vital signs reviewed and stable  Post vital signs: Reviewed and stable  Last Vitals:  Vitals Value Taken Time  BP    Temp    Pulse 78 07/01/20 1249  Resp 21 07/01/20 1249  SpO2 97 % 07/01/20 1249  Vitals shown include unvalidated device data.  Last Pain:  Vitals:   07/01/20 1033  TempSrc: Temporal  PainSc: 0-No pain         Complications: No complications documented.

## 2020-07-02 ENCOUNTER — Encounter: Payer: Self-pay | Admitting: Gastroenterology

## 2020-07-02 LAB — SURGICAL PATHOLOGY

## 2020-07-02 NOTE — Anesthesia Postprocedure Evaluation (Signed)
Anesthesia Post Note  Patient: Emma Silva  Procedure(s) Performed: ESOPHAGOGASTRODUODENOSCOPY (EGD) (N/A ) COLONOSCOPY WITH PROPOFOL (N/A )  Patient location during evaluation: Endoscopy Anesthesia Type: General Level of consciousness: awake and alert Pain management: pain level controlled Vital Signs Assessment: post-procedure vital signs reviewed and stable Respiratory status: spontaneous breathing, nonlabored ventilation, respiratory function stable and patient connected to nasal cannula oxygen Cardiovascular status: blood pressure returned to baseline and stable Postop Assessment: no apparent nausea or vomiting Anesthetic complications: no   No complications documented.   Last Vitals:  Vitals:   07/01/20 1328 07/01/20 1338  BP: 99/61 (!) 106/58  Pulse: 77 71  Resp: 19 18  Temp:    SpO2: 100% 100%    Last Pain:  Vitals:   07/01/20 1338  TempSrc:   PainSc: 0-No pain                 Cleda Mccreedy [name]

## 2020-07-20 ENCOUNTER — Telehealth: Payer: Self-pay | Admitting: Gastroenterology

## 2020-07-20 NOTE — Telephone Encounter (Signed)
Patient has questions about her EGD, colonoscopy and results, please call her.

## 2020-07-22 ENCOUNTER — Telehealth: Payer: Self-pay

## 2020-07-22 NOTE — Telephone Encounter (Signed)
Dr. Maximino Greenland, can you please review her pathology report.

## 2020-07-26 ENCOUNTER — Emergency Department: Payer: BC Managed Care – PPO

## 2020-07-26 ENCOUNTER — Emergency Department
Admission: EM | Admit: 2020-07-26 | Discharge: 2020-07-26 | Disposition: A | Discharge status: Home / Self Care | Payer: BC Managed Care – PPO | Attending: Emergency Medicine | Admitting: Emergency Medicine

## 2020-07-26 ENCOUNTER — Other Ambulatory Visit: Payer: Self-pay

## 2020-07-26 ENCOUNTER — Telehealth: Payer: Self-pay

## 2020-07-26 DIAGNOSIS — R102 Pelvic and perineal pain: Secondary | ICD-10-CM | POA: Insufficient documentation

## 2020-07-26 DIAGNOSIS — Z79899 Other long term (current) drug therapy: Secondary | ICD-10-CM | POA: Insufficient documentation

## 2020-07-26 DIAGNOSIS — N938 Other specified abnormal uterine and vaginal bleeding: Secondary | ICD-10-CM | POA: Diagnosis present

## 2020-07-26 LAB — URINALYSIS, COMPLETE (UACMP) WITH MICROSCOPIC
Bacteria, UA: NONE SEEN
Bilirubin Urine: NEGATIVE
Glucose, UA: NEGATIVE mg/dL
Ketones, ur: NEGATIVE mg/dL
Nitrite: NEGATIVE
Protein, ur: NEGATIVE mg/dL
Specific Gravity, Urine: 1.024 (ref 1.005–1.030)
pH: 8 (ref 5.0–8.0)

## 2020-07-26 LAB — BASIC METABOLIC PANEL
Anion gap: 12 (ref 5–15)
BUN: 15 mg/dL (ref 6–20)
CO2: 24 mmol/L (ref 22–32)
Calcium: 9.3 mg/dL (ref 8.9–10.3)
Chloride: 103 mmol/L (ref 98–111)
Creatinine, Ser: 0.84 mg/dL (ref 0.44–1.00)
GFR, Estimated: >60 mL/min (ref >60)
Glucose, Bld: 96 mg/dL (ref 70–99)
Potassium: 4.1 mmol/L (ref 3.5–5.1)
Sodium: 139 mmol/L (ref 135–145)

## 2020-07-26 LAB — CBC
HCT: 40.4 % (ref 36.0–46.0)
Hemoglobin: 13.3 g/dL (ref 12.0–15.0)
MCH: 29 pg (ref 26.0–34.0)
MCHC: 32.9 g/dL (ref 30.0–36.0)
MCV: 88 fL (ref 80.0–100.0)
Platelets: 317 10*3/uL (ref 150–400)
RBC: 4.59 MIL/uL (ref 3.87–5.11)
RDW: 13.4 % (ref 11.5–15.5)
WBC: 10.6 10*3/uL — ABNORMAL HIGH (ref 4.0–10.5)
nRBC: 0 % (ref 0.0–0.2)

## 2020-07-26 LAB — HCG, QUANTITATIVE, PREGNANCY: hCG, Beta Chain, Quant, S: 1 m[IU]/mL (ref <5)

## 2020-07-26 NOTE — Telephone Encounter (Signed)
Called patient to let her know that she is needing to go to the ED because she stated that it had been 3 weeks that she had her procedure and she is still having abdominal pain, nausea, vomiting and rectal bleeding.

## 2020-07-26 NOTE — Telephone Encounter (Signed)
Patient is having rectal bleeding she states she had a procedure done earlier this month. She wants to know why she is bleeding. Patient wants to get called ASAP

## 2020-07-26 NOTE — ED Provider Notes (Signed)
[facility]-EMERGENCY DEPARTMENT  ____________________________________________  Time seen: Approximately 8:07 PM  I have reviewed the triage vital signs and the nursing notes.   HISTORY  Chief Complaint Vaginal Bleeding   Historian Patient     HPI Emma Silva is a 20 y.o. female presents to the emergency department with vaginal bleeding that started today.  Patient states that she recently had a endoscopy and colonoscopy with no acute findings patient states that she has chronic abdominal discomfort in her gastroenterologist recommended that she seek care at the emergency department as she could not identify competently the source of bleeding was vaginal nature.  She denies dysuria, increased urinary frequency or low back pain.  Patient states that she has had cramping and had 2 episodes of vomiting today.  Patient states it is not atypical for her to have vomiting on a daily basis.  No prior history of ovarian torsion.  Patient states that she is not sexually active.   Past Medical History:  Diagnosis Date  . GERD (gastroesophageal reflux disease)      Immunizations up to date:  Yes.     Past Medical History:  Diagnosis Date  . GERD (gastroesophageal reflux disease)     Patient Active Problem List   Diagnosis Date Noted  . Abdominal pain   . Diarrhea     Past Surgical History:  Procedure Laterality Date  . COLONOSCOPY WITH PROPOFOL N/A 07/01/2020   Procedure: COLONOSCOPY WITH PROPOFOL;  Surgeon: Pasty Spillers, MD;  Location: [facility] ENDOSCOPY;  Service: Endoscopy;  Laterality: N/A;  . ESOPHAGOGASTRODUODENOSCOPY N/A 07/01/2020   Procedure: ESOPHAGOGASTRODUODENOSCOPY (EGD);  Surgeon: Pasty Spillers, MD;  Location: [facility] ENDOSCOPY;  Service: Endoscopy;  Laterality: N/A;  . KNEE ARTHROSCOPY      Prior to Admission medications   Medication Sig Start Date End Date Taking? Authorizing Provider  levonorgestrel-ethinyl estradiol (ALESSE) 0.1-20 MG-MCG tablet Take 1  tablet by mouth daily.    [provider]  omeprazole (PRILOSEC) 40 MG capsule Take 1 capsule (40 mg total) by mouth in the morning and at bedtime. 05/05/20   Pasty Spillers, MD  ondansetron (ZOFRAN) 4 MG tablet Take 4 mg by mouth every 8 (eight) hours as needed for nausea or vomiting.    [provider]  polyethylene glycol-electrolytes (NULYTELY) 420 g solution Prepare according to package instructions. Starting at 5:00 PM: Drink one 8 oz glass of mixture every 15 minutes until you finish half of the jug. Five hours prior to procedure, drink 8 oz glass of mixture every 15 minutes until it is all gone. Make sure you do not drink anything 4 hours prior to your procedure. 06/08/20   Pasty Spillers, MD  Pseudoephedrine-guaiFENesin (CHEST CONGESTION RELIEF D PO) Take 1 tablet by mouth every 6 (six) hours as needed. Patient not taking: Reported on 06/08/2020    [provider]    Allergies Patient has no known allergies.  No family history on file.  Social History Social History   Tobacco Use  . Smoking status: Never Smoker  . Smokeless tobacco: Never Used  Vaping Use  . Vaping Use: Never used  Substance Use Topics  . Alcohol use: Yes    Comment: socially  . Drug use: Never     Review of Systems  Constitutional: No fever/chills Eyes:  No discharge ENT: No upper respiratory complaints. Respiratory: no cough. No SOB/ use of accessory muscles to breath Gastrointestinal:   No nausea, no vomiting.  No diarrhea.  No constipation. Genitourinary:  Patient has vaginal bleeding.  Musculoskeletal: Negative for musculoskeletal pain. Skin: Negative for rash, abrasions, lacerations, ecchymosis.   ____________________________________________   PHYSICAL EXAM:  VITAL SIGNS: ED Triage Vitals [07/26/20 1707]  Enc Vitals Group     BP 120/87     Pulse Rate 92     Resp 18     Temp 98 F (36.7 C)     Temp src      SpO2 100 %     Weight      Height       Head Circumference      Peak Flow      Pain Score 4     Pain Loc      Pain Edu?      Excl. in GC?      Constitutional: Alert and oriented. Well appearing and in no acute distress. Eyes: Conjunctivae are normal. PERRL. EOMI. Head: Atraumatic. Cardiovascular: Normal rate, regular rhythm. Normal S1 and S2.  Good peripheral circulation. Respiratory: Normal respiratory effort without tachypnea or retractions. Lungs CTAB. Good air entry to the bases with no decreased or absent breath sounds Gastrointestinal: Bowel sounds x 4 quadrants. Soft and nontender to palpation. No guarding or rigidity. No distention.  Bleeding visualized from vaginal introitus. Musculoskeletal: Full range of motion to all extremities. No obvious deformities noted Neurologic:  Normal for age. No gross focal neurologic deficits are appreciated.  Skin:  Skin is warm, dry and intact. No rash noted. Psychiatric: Mood and affect are normal for age. Speech and behavior are normal.   ____________________________________________   LABS (all labs ordered are listed, but only abnormal results are displayed)  Labs Reviewed  CBC - Abnormal; Notable for the following components:      Result Value   WBC 10.6 (*)    All other components within normal limits  URINALYSIS, COMPLETE (UACMP) WITH MICROSCOPIC - Abnormal; Notable for the following components:   Color, Urine YELLOW (*)    APPearance CLOUDY (*)    Hgb urine dipstick MODERATE (*)    Leukocytes,Ua SMALL (*)    All other components within normal limits  BASIC METABOLIC PANEL  HCG, QUANTITATIVE, PREGNANCY  POC URINE PREG, ED   ____________________________________________  EKG   ____________________________________________  RADIOLOGY Geraldo Pitter, personally viewed and evaluated these images (plain radiographs) as part of my medical decision making, as well as reviewing the written report by the radiologist.    US PELVIC COMPLETE W TRANSVAGINAL AND  TORSION R/O  Result Date: 07/26/2020 CLINICAL DATA:  Initial evaluation for acute pelvic pain. EXAM: TRANSABDOMINAL AND TRANSVAGINAL ULTRASOUND OF PELVIS DOPPLER ULTRASOUND OF OVARIES TECHNIQUE: Both transabdominal and transvaginal ultrasound examinations of the pelvis were performed. Transabdominal technique was performed for global imaging of the pelvis including uterus, ovaries, adnexal regions, and pelvic cul-de-sac. It was necessary to proceed with endovaginal exam following the transabdominal exam to visualize the uterus, endometrium, and ovaries. Color and duplex Doppler ultrasound was utilized to evaluate blood flow to the ovaries. COMPARISON:  None. FINDINGS: Uterus Measurements: 5.8 x 3.0 x 3.9 cm = volume: 34.8 mL. No fibroids or other mass visualized. Endometrium Thickness: 3.3 mm.  No focal abnormality visualized. Right ovary Measurements: 2.5 x 0.9 x 2.0 cm = volume: 2.4 mL. Normal appearance/no adnexal mass. Left ovary Measurements: 2.3 x 1.5 x 2.6 cm = volume: 4.7 mL. Normal appearance/no adnexal mass. Pulsed Doppler evaluation of both ovaries demonstrates normal low-resistance arterial and venous waveforms. Other findings No abnormal free fluid. IMPRESSION: Normal  pelvic ultrasound. No evidence for torsion or other acute abnormality. Electronically Signed   By: Rise Mu M.D.   On: 07/26/2020 22:49    ____________________________________________    PROCEDURES  Procedure(s) performed:     Procedures     Medications - No data to display   ____________________________________________   INITIAL IMPRESSION / ASSESSMENT AND PLAN / ED COURSE  Pertinent labs & imaging results that were available during my care of the patient were reviewed by me and considered in my medical decision making (see chart for details).  Clinical Course as of 07/26/20 2255  Mon Jul 26, 2020  1952 hCG, quantitative, pregnancy [JW]  2230 Bacteria, UA: NONE SEEN [JW]    Clinical Course  User Index [JW] Orvil Feil, PA-C      Assessment and Plan:  Vaginal Bleeding:  20 year old female presents to the emergency department with vaginal bleeding that started today.  Vital signs were reassuring at triage.  CBC and BMP were reassuring.  Beta-hCG was less than 1.  Pelvic ultrasound with torsion rule out reveals no evidence of acute abnormality.  We will have patient follow-up with primary care provider regarding irregular vaginal bleeding.  Return precautions were given to return with new or worsening symptoms.  Tylenol and ibuprofen alternating were recommended for discomfort.  ____________________________________________  FINAL CLINICAL IMPRESSION(S) / ED DIAGNOSES  Final diagnoses:  Pelvic pain      NEW MEDICATIONS STARTED DURING THIS VISIT:  ED Discharge Orders    None          This chart was dictated using voice recognition software/Dragon. Despite best efforts to proofread, errors can occur which can change the meaning. Any change was purely unintentional.     Orvil Feil, PA-C 07/26/20 2256    Concha Se, MD 07/26/20 [phone]

## 2020-07-26 NOTE — ED Triage Notes (Addendum)
Pt comes via POV from home with c/o vaginal bleeding. Pt states she had a procedure earlier this month.  Pt states she had a colonoscopy and upper endoscopy. Pt states bright red bleeding started this am and she got off her period a week ago.  Pt states it was heavy bleeding earlier and now has decreased. Pt states she is having to wear a panty liner.

## 2020-07-29 ENCOUNTER — Encounter: Payer: Self-pay | Admitting: Gastroenterology

## 2020-07-29 ENCOUNTER — Other Ambulatory Visit: Payer: Self-pay

## 2020-07-29 ENCOUNTER — Ambulatory Visit (INDEPENDENT_AMBULATORY_CARE_PROVIDER_SITE_OTHER): Payer: BC Managed Care – PPO | Admitting: Gastroenterology

## 2020-07-29 VITALS — BP 110/70 | HR 80 | Temp 98.1°F | Wt 135.0 lb

## 2020-07-29 DIAGNOSIS — R109 Unspecified abdominal pain: Secondary | ICD-10-CM | POA: Diagnosis not present

## 2020-07-29 DIAGNOSIS — R195 Other fecal abnormalities: Secondary | ICD-10-CM

## 2020-07-29 MED ORDER — DICYCLOMINE HCL 10 MG PO CAPS: 10.0000 mg | ORAL_CAPSULE | Freq: Two times a day (BID) | ORAL | 0 refills | Status: DC | PRN

## 2020-07-29 NOTE — Patient Instructions (Signed)
Please come in 4 weeks to do a H Pylori breath test. There's no need to schedule an appointment.  Please stop taking Prilosec.

## 2020-07-30 NOTE — Telephone Encounter (Signed)
Patient came in and had an appointment with Dr. Maximino Greenland yesterday.

## 2020-07-30 NOTE — Progress Notes (Signed)
[name], MD [address]  [address]  [place], [place] [address]  Main: [phone]  Fax: [phone]   Primary Care Physician: [facility], [facility]   Chief Complaint  Patient presents with  . Abdominal Pain    HPI: Emma Silva is a 20 y.o. female presents for follow-up of abdominal pain, nausea.  Patient states symptoms continue despite PPI treatment.  EGD and colonoscopy did not reveal any concerning findings.  Inactive gastritis noted on gastric biopsies without evidence of H. pylori.  No weight loss.  No dysphagia.  Reports abdominal cramping intermittently with or without meals.  Current Outpatient Medications  Medication Sig Dispense Refill  . dicyclomine (BENTYL) 10 MG capsule Take 1 capsule (10 mg total) by mouth 2 (two) times daily as needed for up to 60 doses for spasms. 60 capsule 0  . levonorgestrel-ethinyl estradiol (ALESSE) 0.1-20 MG-MCG tablet Take 1 tablet by mouth daily.    Marland Kitchen omeprazole (PRILOSEC) 40 MG capsule Take 1 capsule (40 mg total) by mouth in the morning and at bedtime. 56 capsule 0  . ondansetron (ZOFRAN) 4 MG tablet Take 4 mg by mouth every 8 (eight) hours as needed for nausea or vomiting.     No current facility-administered medications for this visit.    Allergies as of 07/29/2020  . (No Known Allergies)    ROS:  General: Negative for anorexia, weight loss, fever, chills, fatigue, weakness. ENT: Negative for hoarseness, difficulty swallowing , nasal congestion. CV: Negative for chest pain, angina, palpitations, dyspnea on exertion, peripheral edema.  Respiratory: Negative for dyspnea at rest, dyspnea on exertion, cough, sputum, wheezing.  GI: See history of present illness. GU:  Negative for dysuria, hematuria, urinary incontinence, urinary frequency, nocturnal urination.  Endo: Negative for unusual weight change.    Physical Examination:   BP 110/70   Pulse 80   Temp 98.1 F (36.7 C) (Oral)   Wt 135 lb (61.2  kg)   LMP 07/19/2020   BMI 24.69 kg/m   General: Well-nourished, well-developed in no acute distress.  Eyes: No icterus. Conjunctivae pink. Mouth: Oropharyngeal mucosa moist and pink , no lesions erythema or exudate. Neck: Supple, Trachea midline Abdomen: Bowel sounds are normal, nontender, nondistended, no hepatosplenomegaly or masses, no abdominal bruits or hernia , no rebound or guarding.   Extremities: No lower extremity edema. No clubbing or deformities. Neuro: Alert and oriented x 3.  Grossly intact. Skin: Warm and dry, no jaundice.   Psych: Alert and cooperative, normal mood and affect.   Labs: CMP     Component Value Date/Time   NA 139 07/26/2020 1711   NA 141 05/28/2020 1132   K 4.1 07/26/2020 1711   CL 103 07/26/2020 1711   CO2 24 07/26/2020 1711   GLUCOSE 96 07/26/2020 1711   BUN 15 07/26/2020 1711   BUN 10 05/28/2020 1132   CREATININE 0.84 07/26/2020 1711   CALCIUM 9.3 07/26/2020 1711   PROT 7.7 05/28/2020 1132   ALBUMIN 4.8 05/28/2020 1132   AST 19 05/28/2020 1132   ALT 23 05/28/2020 1132   ALKPHOS 78 05/28/2020 1132   BILITOT <0.2 05/28/2020 1132   GFRNONAA >60 07/26/2020 1711   GFRAA 111 05/28/2020 1132   Lab Results  Component Value Date   WBC 10.6 (H) 07/26/2020   HGB 13.3 07/26/2020   HCT 40.4 07/26/2020   MCV 88.0 07/26/2020   PLT 317 07/26/2020    Imaging Studies: US PELVIC COMPLETE W TRANSVAGINAL AND TORSION R/O  Result Date: 07/26/2020 CLINICAL DATA:  Initial evaluation for acute pelvic pain. EXAM: TRANSABDOMINAL AND TRANSVAGINAL ULTRASOUND OF PELVIS DOPPLER ULTRASOUND OF OVARIES TECHNIQUE: Both transabdominal and transvaginal ultrasound examinations of the pelvis were performed. Transabdominal technique was performed for global imaging of the pelvis including uterus, ovaries, adnexal regions, and pelvic cul-de-sac. It was necessary to proceed with endovaginal exam following the transabdominal exam to visualize the uterus, endometrium, and  ovaries. Color and duplex Doppler ultrasound was utilized to evaluate blood flow to the ovaries. COMPARISON:  None. FINDINGS: Uterus Measurements: 5.8 x 3.0 x 3.9 cm = volume: 34.8 mL. No fibroids or other mass visualized. Endometrium Thickness: 3.3 mm.  No focal abnormality visualized. Right ovary Measurements: 2.5 x 0.9 x 2.0 cm = volume: 2.4 mL. Normal appearance/no adnexal mass. Left ovary Measurements: 2.3 x 1.5 x 2.6 cm = volume: 4.7 mL. Normal appearance/no adnexal mass. Pulsed Doppler evaluation of both ovaries demonstrates normal low-resistance arterial and venous waveforms. Other findings No abnormal free fluid. IMPRESSION: Normal pelvic ultrasound. No evidence for torsion or other acute abnormality. Electronically Signed   By: Rise Mu M.D.   On: 07/26/2020 22:49    Assessment and Plan:   Emma Silva is a 20 y.o. y/o female here for follow-up of abdominal pain, nausea  Symptoms are likely functional in nature and this was discussed with her in detail  PPI did not help the patient and I have asked her to discontinue it.  Obtain H. pylori breath test in 4 weeks as H. pylori IgA was previously positive but biopsies did not show any evidence of H. Pylori  Repeat fecal calprotectin as it was mildly elevated in the past  Start Bentyl as needed  Low FODMAP diet discussed and handout given for the same  Samples of IBS gard given as well  Patient encouraged to continue to work on improving stressors/anxiety with PCP  TTG IgA mildly abnormal but duodenal biopsies does not show celiac disease.  We will repeat  Dr [name]

## 2020-08-20 ENCOUNTER — Other Ambulatory Visit: Payer: Self-pay | Admitting: Gastroenterology

## 2020-08-24 LAB — TISSUE TRANSGLUTAMINASE ABS,IGG,IGA
Tissue Transglut Ab: <2 U/mL (ref 0–5)
Transglutaminase IgA: <2 U/mL (ref 0–3)

## 2020-08-25 ENCOUNTER — Other Ambulatory Visit: Payer: Self-pay | Admitting: Gastroenterology

## 2020-08-27 LAB — H. PYLORI BREATH TEST: H pylori Breath Test: NEGATIVE

## 2020-08-27 LAB — CALPROTECTIN, FECAL: Calprotectin, Fecal: 18 ug/g (ref 0–120)

## 2020-09-21 NOTE — Progress Notes (Signed)
* * *        **  Alison Hodges**    --- ---    70 Y old Female, DOB: 10-Jul-2000    [address] [place], [place] [address]    Home: [phone]    Provider: Robby Sermon, MD, Gennie Alma        * * *    Telephone Encounter    ---    Answered by   Rayford Halsted A.  Date: 04/09/2018         Time: 04:01 PM    Caller   Meraly    --- ---            Reason   Referral Pending            Message                      Clld home and spoke with Dad.  Received referral request for [facility] for 04/18/18 & Patient has Dr Ellwood Handler at [facility] listed under [facility].  Referral cannot be processed unless PCP is changed to Dr. Onalee Hua or Dr. Robby Sermon.   Dad aware & will call us back to let us know if patient transferring to Dr. Ellwood Handler or staying at [facility].  Referral is pending                Action Taken                      Rayford Halsted A. 04/09/2018 4:05:45 PM >                     * * *                ---          * * *          Patient: Alison Hodges DOB: 04/22/2001 Provider: Robby Sermon, MD, Gennie Alma.  04/09/2018    ---    Note generated by eClinicalWorks EMR/PM Software ([email or web address])

## 2020-09-21 NOTE — Progress Notes (Signed)
****    ---    **Patient:** Alison Hodges     **Account Number:** [id number]   **Provider:** Marland Kitchen, MD     **DOB:** Apr 15, 2001 **Age:** 60 Y **Sex:** Female   **Date:** 04/15/2017     **Phone:** [phone]     **Address:** [address], [place], [place]     **Pcp:** Maia Breslow, MD        * * *         **Subjective:**        ---       **Chief Complaints:**    --- ---           * Face injury        --- ---           * October 21st, 2018 Sunday 10:00am. Sick-DA        --- ---    ---       **HPI:**    _HPI_ :    Pt here alone    states hit near mouth in soccer game on Thurs by another kids hand in the  forehead and down to mouth    felt dizzy after, not taken to ED    AV, lpn    Hit in head at soccer game; getting bad HA's, rated 7-8/10 thn ibuprofen makes  them better but it has not gone away completely; and nausea, no vomit. Has hd  constant HA since the injury. Initially felt like she was going to pass but  but she didnt. The dizzines gone.    Denies : Fever. Runny Nose. Cough. Sore Throat. Ear Pain/Pulling Ears.  Abdominal Pain. Vomits. Diarrhea. Rash. Joint Pains.    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 very mild intermittent. not recquirg Albuterol , 01/2016        --- ---           * R radial buckle Fx--12/26/05, RESOLVED        --- ---           * vasovagal reaction with pain - syncope, urinary incontinence - 12/08, refer to Dr. Durward Mallard 12/10/09, RESOLVED        --- ---           * went to [facility]copy, 06/16/10. EKG, Lt axis deviation. Needs F up with cardiology per ED MD. [facility] Card[facility] MD at [facility] by ED Md. 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 still ongoing lt knee discomfort, Re  refer to [facility]TH[facility] , 01/2016, on going 02/2017        --- ---           * Father declined HPV vaccine 01/2016, 02/2017. declined meningitis vaccine 02/2017        --- ---           * refer to counselling  for Anxiety 02/2017        --- ---    ---       **Medications:**    --- ---    Not-Taking        * ProAir HFA 90 mcg/inh aerosol 2 puff(s) inhaled 4 times a day        --- ---           * albuterol 2.5 mg/3 mL (0.083%) solution 1 vial inhaled 4-6hrs prn cough/wheeze        --- ---           * Claritin 10 mg tablet 1 tab(s) orally once a day        --- ---           * ProAir HFA CFC free 90 mcg/inh aerosol 2 puff(s) inhaled 4-6hrs prn cough/wheeze        --- ---           * Medication List reviewed and reconciled with the patient        --- ---    ---       **Allergies:**    --- ---           * PCN: Mom allergic to PCN/ does not want child to have        --- ---    ---         **Objective:**        ---       **Examination:    ** _David's Brief Exam:_          * General Appearance active and alert, well hydrated mucosas, no distress.         --- ---           * Eyes clear scleraes and conjunctivas, normal eyelids.         --- ---           * Ears no lesions on external ears, normal auditory canals; TM's are wnl.         --- ---           * Nose normal shape, straight septum.         --- ---           * Neck symmetrical, no visible/palpable masses.         --- ---           * Throat/Mouth normal oropharynx.         --- ---           * Lymph Nodes no cervical adenopathy.         --- ---           * Lungs clear to auscultation bilaterally with normal respiratory effort.         --- ---           * Heart RR, no murmur.         --- ---           * Skin no rashes.         --- ---    _Neurology:_        * Cortical functions: well orientedx3.         --- ---           * Cranial nerves: II-XII normal bilaterally except auditory; she was confused as to where I was in the room with her eyes closed.         --- ---           *  Sensory  exam: somewhat off.         --- ---           * Gait: normal.         --- ---           * Balance this was almost normal.         --- ---             **Assessment:**        ---       **Assessment:**          * Concussion without loss of consciousness, initial encounter - S06.0X0A (Primary)         --- ---        **Plan:**        ---         **1\. Concussion without loss of consciousness, initial encounter**    Notes: discussed Tx, hydration, ibuprofen, no spirts, no visuals (phone,  computers, tv, etc), no testing in school; should see Neuro in next 48hrs; we  will call for visit.    ---      **Immunizations:**    --- ---      **Follow Up:** prn    --- ---    ---    ---        **Provider:** Marland Kitchen, MD    ---     **Patient:** Alison Hodges **DOB:** 2000/12/24 **Date:** 04/15/2017    ---    Electronically signed by Marland Kitchen, MD , MD on 04/26/2017 at 09:24 AM EDT    Sign off status: Completed

## 2021-01-12 ENCOUNTER — Telehealth: Payer: Self-pay | Admitting: Gastroenterology

## 2021-01-12 ENCOUNTER — Other Ambulatory Visit: Payer: Self-pay | Admitting: Gastroenterology

## 2021-01-12 NOTE — Telephone Encounter (Signed)
dicyclomine (BENTYL) 10 MG capsule  and ondansetron (ZOFRAN) 4 MG tablet  [place] and need to have enough   [facility] [address] [facility] [place]

## 2021-01-13 NOTE — Addendum Note (Signed)
Addended by: Roena Malady on: 01/13/2021 04:54 PM   Modules accepted: Orders

## 2021-01-13 NOTE — Telephone Encounter (Signed)
Last OV 07/2020... I do not see where you ever filled the Zofran...Marland Kitchen please advise if appropriate to fill

## 2021-01-14 MED ORDER — DICYCLOMINE HCL 10 MG PO CAPS: 10.0000 mg | ORAL_CAPSULE | Freq: Two times a day (BID) | ORAL | 0 refills | Status: DC | PRN

## 2021-01-14 MED ORDER — ONDANSETRON HCL 4 MG PO TABS: 4.0000 mg | ORAL_TABLET | Freq: Three times a day (TID) | ORAL | 0 refills | Status: DC | PRN

## 2021-01-24 ENCOUNTER — Other Ambulatory Visit: Payer: Self-pay | Admitting: Gastroenterology

## 2021-02-02 ENCOUNTER — Other Ambulatory Visit: Payer: Self-pay | Admitting: Gastroenterology

## 2021-02-07 ENCOUNTER — Telehealth: Payer: Self-pay | Admitting: Gastroenterology

## 2021-02-07 NOTE — Telephone Encounter (Signed)
ondansetron (ZOFRAN) 4 MG tablet, dicyclomine (BENTYL) 10 MG capsule  [facility] [address] [place], [place]  Emma Silva is going abroad until 06/26/2021 and will need enough medication to be called into pharmacy. She will be flying out 02/19/21.

## 2021-02-08 ENCOUNTER — Other Ambulatory Visit: Payer: Self-pay | Admitting: Gastroenterology

## 2021-02-08 MED ORDER — ONDANSETRON HCL 4 MG PO TABS: 4.0000 mg | ORAL_TABLET | Freq: Three times a day (TID) | ORAL | 0 refills | Status: DC | PRN

## 2021-02-08 MED ORDER — DICYCLOMINE HCL 10 MG PO CAPS: 10.0000 mg | ORAL_CAPSULE | Freq: Two times a day (BID) | ORAL | 0 refills | Status: DC | PRN

## 2021-02-14 ENCOUNTER — Other Ambulatory Visit: Payer: Self-pay | Admitting: Gastroenterology

## 2021-02-15 DIAGNOSIS — R1110 Vomiting, unspecified: Secondary | ICD-10-CM | POA: Insufficient documentation

## 2021-02-15 DIAGNOSIS — F419 Anxiety disorder, unspecified: Secondary | ICD-10-CM | POA: Insufficient documentation

## 2021-02-15 DIAGNOSIS — J4520 Mild intermittent asthma, uncomplicated: Secondary | ICD-10-CM | POA: Insufficient documentation

## 2021-02-22 NOTE — Telephone Encounter (Deleted)
error 

## 2021-02-22 NOTE — Telephone Encounter (Signed)
error 

## 2021-02-26 ENCOUNTER — Other Ambulatory Visit: Payer: Self-pay | Admitting: Gastroenterology

## 2021-03-27 ENCOUNTER — Other Ambulatory Visit: Payer: Self-pay | Admitting: Gastroenterology

## 2021-10-17 ENCOUNTER — Other Ambulatory Visit: Payer: Self-pay

## 2021-10-17 ENCOUNTER — Emergency Department: Payer: No Typology Code available for payment source

## 2021-10-17 ENCOUNTER — Emergency Department
Admission: EM | Admit: 2021-10-17 | Discharge: 2021-10-17 | Disposition: A | Discharge status: Home / Self Care | Payer: No Typology Code available for payment source | Attending: Emergency Medicine | Admitting: Emergency Medicine

## 2021-10-17 DIAGNOSIS — K529 Noninfective gastroenteritis and colitis, unspecified: Secondary | ICD-10-CM | POA: Diagnosis not present

## 2021-10-17 DIAGNOSIS — D72829 Elevated white blood cell count, unspecified: Secondary | ICD-10-CM | POA: Diagnosis not present

## 2021-10-17 DIAGNOSIS — R112 Nausea with vomiting, unspecified: Secondary | ICD-10-CM | POA: Diagnosis present

## 2021-10-17 LAB — CBC
HCT: 48 % — ABNORMAL HIGH (ref 36.0–46.0)
Hemoglobin: 16 g/dL — ABNORMAL HIGH (ref 12.0–15.0)
MCH: 28.9 pg (ref 26.0–34.0)
MCHC: 33.3 g/dL (ref 30.0–36.0)
MCV: 86.6 fL (ref 80.0–100.0)
Platelets: 348 10*3/uL (ref 150–400)
RBC: 5.54 MIL/uL — ABNORMAL HIGH (ref 3.87–5.11)
RDW: 12.1 % (ref 11.5–15.5)
WBC: 10.6 10*3/uL — ABNORMAL HIGH (ref 4.0–10.5)
nRBC: 0 % (ref 0.0–0.2)

## 2021-10-17 LAB — COMPREHENSIVE METABOLIC PANEL
ALT: 21 U/L (ref 0–44)
AST: 22 U/L (ref 15–41)
Albumin: 4.7 g/dL (ref 3.5–5.0)
Alkaline Phosphatase: 62 U/L (ref 38–126)
Anion gap: 8 (ref 5–15)
BUN: 18 mg/dL (ref 6–20)
CO2: 26 mmol/L (ref 22–32)
Calcium: 9.3 mg/dL (ref 8.9–10.3)
Chloride: 101 mmol/L (ref 98–111)
Creatinine, Ser: 0.8 mg/dL (ref 0.44–1.00)
GFR, Estimated: >60 mL/min (ref >60)
Glucose, Bld: 107 mg/dL — ABNORMAL HIGH (ref 70–99)
Potassium: 4.2 mmol/L (ref 3.5–5.1)
Sodium: 135 mmol/L (ref 135–145)
Total Bilirubin: 1.3 mg/dL — ABNORMAL HIGH (ref 0.3–1.2)
Total Protein: 8.5 g/dL — ABNORMAL HIGH (ref 6.5–8.1)

## 2021-10-17 LAB — URINALYSIS, ROUTINE W REFLEX MICROSCOPIC
Bilirubin Urine: NEGATIVE
Glucose, UA: NEGATIVE mg/dL
Hgb urine dipstick: NEGATIVE
Ketones, ur: 20 mg/dL — AB
Nitrite: NEGATIVE
Protein, ur: 30 mg/dL — AB
Specific Gravity, Urine: 1.026 (ref 1.005–1.030)
pH: 7 (ref 5.0–8.0)

## 2021-10-17 LAB — LIPASE, BLOOD: Lipase: 28 U/L (ref 11–51)

## 2021-10-17 LAB — PREGNANCY, URINE: Preg Test, Ur: NEGATIVE

## 2021-10-17 LAB — CHLAMYDIA/NGC RT PCR (ARMC ONLY)
Chlamydia Tr: NOT DETECTED
N gonorrhoeae: NOT DETECTED

## 2021-10-17 LAB — WET PREP, GENITAL
Clue Cells Wet Prep HPF POC: NONE SEEN
Sperm: NONE SEEN
Trich, Wet Prep: NONE SEEN
WBC, Wet Prep HPF POC: <10 (ref <10)
Yeast Wet Prep HPF POC: NONE SEEN

## 2021-10-17 LAB — POC URINE PREG, ED: Preg Test, Ur: NEGATIVE

## 2021-10-17 MED ORDER — IOHEXOL 300 MG/ML  SOLN
80.0000 mL | Freq: Once | INTRAMUSCULAR | Status: AC | PRN
  Administered 2021-10-17: 80 mL via INTRAVENOUS
  Filled 2021-10-17: qty 80

## 2021-10-17 MED ORDER — METOCLOPRAMIDE HCL 5 MG/ML IJ SOLN
10.0000 mg | Freq: Once | INTRAMUSCULAR | Status: AC
  Administered 2021-10-17: 10 mg via INTRAVENOUS
  Filled 2021-10-17: qty 2

## 2021-10-17 MED ORDER — ONDANSETRON 4 MG PO TBDP: 4.0000 mg | ORAL_TABLET | Freq: Three times a day (TID) | ORAL | 0 refills | Status: DC | PRN

## 2021-10-17 MED ORDER — SODIUM CHLORIDE 0.9 % IV BOLUS
1000.0000 mL | Freq: Once | INTRAVENOUS | Status: AC
  Administered 2021-10-17: 1000 mL via INTRAVENOUS

## 2021-10-17 MED ORDER — KETOROLAC TROMETHAMINE 15 MG/ML IJ SOLN
15.0000 mg | Freq: Once | INTRAMUSCULAR | Status: AC
  Administered 2021-10-17: 15 mg via INTRAVENOUS
  Filled 2021-10-17: qty 1

## 2021-10-17 NOTE — ED Triage Notes (Signed)
Pt to ED for vomiting and upper abdominal pain that started last night. Has vomited about 8-10 times so far today. Pt took zofran at 0300 today and has rx for it.  ? ?Pain is throbbing, across upper abdomen baseline 6/10 with waves of severe sharp, shooting pain that reaches 9/10. Pt was seen at student services at [facility] today and told to have eval at ER. ? ?Also endorses 4 diarrheal stools since last night. Denies blood in stool. ? ?Has had GI issues in past and has already had upper endoscopy and colonoscopy.  ? ?LNMP was 3 weeks ago. ? ?Pt requests blood draw to be in room while laying down, states has vasovagal response to having blood drawn. ?

## 2021-10-17 NOTE — ED Provider Notes (Signed)
? ?[facility] ?Provider Note ? ? ? Event Date/Time  ? First MD Initiated Contact with Patient 10/17/21 1243   ?  (approximate) ? ? ?History  ? ?Emesis and Abdominal Pain ? ? ?HPI ? ?Emma Silva is a 21 y.o. female  with IBS and gerd who comes in abdominal pain and emesis.  The symptoms started last night. She reports 10 episodes. She reports diarrhea as well. The pain is throbbing and stabbing and comes and go. Reports throwing up a trial of zofran at home. No fevers. LMP 3 weeks ago.  NO vaginal discharge. No abdominal surgeries. Reports prior endoscopy and colonscopy that was normal. Bentyl and zofran at home she takes.  Patient reports the pain is mostly in her right lower quadrant.  She denies ever having her appendix removed before.  She denies ever having pain like this before.  Patient does report being sexually active but denies any new vaginal discharge. ? ? ?Physical Exam  ? ?Triage Vital Signs: ?ED Triage Vitals  ?Enc Vitals Group  ?   BP 10/17/21 1150 (!) 118/95  ?   Pulse Rate 10/17/21 1150 (!) 117  ?   Resp 10/17/21 1150 16  ?   Temp 10/17/21 1150 98.1 ?F (36.7 ?C)  ?   Temp Source 10/17/21 1150 Oral  ?   SpO2 10/17/21 1150 98 %  ?   Weight 10/17/21 1145 150 lb (68 kg)  ?   Height 10/17/21 1145 5\' 3"  (1.6 m)  ?   Head Circumference --   ?   Peak Flow --   ?   Pain Score 10/17/21 1144 6  ?   Pain Loc --   ?   Pain Edu? --   ?   Excl. in GC? --   ? ? ?Most recent vital signs: ?Vitals:  ? 10/17/21 1150  ?BP: (!) 118/95  ?Pulse: (!) 117  ?Resp: 16  ?Temp: 98.1 ?F (36.7 ?C)  ?SpO2: 98%  ? ? ? ?General: Awake, no distress.  ?CV:  Good peripheral perfusion.  ?Resp:  Normal effort.  ?Abd:  No distention.  Patient is tender in her right lower quadrant with rebound and positive Rovsing tenderness ?Other:   ? ? ?ED Results / Procedures / Treatments  ? ?Labs ?(all labs ordered are listed, but only abnormal results are displayed) ?Labs Reviewed  ?COMPREHENSIVE METABOLIC PANEL - Abnormal;  Notable for the following components:  ?    Result Value  ? Glucose, Bld 107 (*)   ? Total Protein 8.5 (*)   ? Total Bilirubin 1.3 (*)   ? All other components within normal limits  ?CBC - Abnormal; Notable for the following components:  ? WBC 10.6 (*)   ? RBC 5.54 (*)   ? Hemoglobin 16.0 (*)   ? HCT 48.0 (*)   ? All other components within normal limits  ?LIPASE, BLOOD  ?URINALYSIS, ROUTINE W REFLEX MICROSCOPIC  ?POC URINE PREG, ED  ? ? ? ? ?RADIOLOGY ?I have reviewed the ct personally and agree with radiology read ? ? ? ? ? ?PROCEDURES: ? ?Critical Care performed: No ? ?Procedures ? ? ?MEDICATIONS ORDERED IN ED: ?Medications  ?ketorolac (TORADOL) 15 MG/ML injection 15 mg (has no administration in time range)  ?metoCLOPramide (REGLAN) injection 10 mg (has no administration in time range)  ?sodium chloride 0.9 % bolus 1,000 mL (1,000 mLs Intravenous New Bag/Given 10/17/21 1206)  ? ? ? ?IMPRESSION / MDM / ASSESSMENT AND PLAN / ED  COURSE  ?I reviewed the triage vital signs and the nursing notes. ? ? ?Patient comes in tachycardic with abdominal discomfort and nausea vomiting.  Could be gastroenteritis but she is pretty tender in her right lower quadrant with positive signs for possible appendicitis.  We discussed the pros and cons of CT imaging and patient is okay with proceeding to rule out appendicitis.  Patient is getting some IV fluids, IV Toradol, IV Reglan to help with symptoms in the meantime.  Labs ordered evaluate for any Electra abnormalities, AKI ? ?I reviewed patient's office visit from 01/2021 where she was seen for OCP ? ?Wet prep negative.  Pregnancy test negative.  Urine without significant evidence of UTI given squamous cells.  Lipase normal CMP normal several slightly elevated total bili.  White count slightly elevated but hemoglobin stable.  Patient had off to oncoming team pending CT ? ? ? ? ?FINAL CLINICAL IMPRESSION(S) / ED DIAGNOSES  ? ?Final diagnoses:  ?Gastroenteritis  ? ? ? ?Rx / DC Orders   ? ?ED Discharge Orders   ? ? None  ? ?  ? ? ? ?Note:  This document was prepared using Dragon voice recognition software and may include unintentional dictation errors. ?  ?Concha Se, MD ?10/17/21 1518 ? ?

## 2021-10-17 NOTE — ED Provider Notes (Signed)
----------------------------------------- ?  3:46 PM on 10/17/2021 ?----------------------------------------- ?Patient's work-up is overall reassuring.  Urinalysis shows no concerning findings.  Pregnancy negative.  Wet prep is negative.  CBC shows slight white blood cell elevation at 10.6 CMP is normal.  CT has resulted showing signs of possible mild enteritis.  This fits the patient's clinical picture of gastroenteritis.  I have personally seen and spoken to the patient.  We will start the patient on Zofran to be used as needed, loperamide to be used if needed.  Discussed supportive care at home.  As well as return precautions.  Patient agreeable to plan. ?  ?Minna Antis, MD ?10/17/21 1547 ? ?

## 2021-10-17 NOTE — ED Notes (Signed)
Drew blood. Pt now vomiting again and states feels dizzy. IV in place, resting in recliner. ?

## 2021-10-25 ENCOUNTER — Ambulatory Visit (INDEPENDENT_AMBULATORY_CARE_PROVIDER_SITE_OTHER): Payer: No Typology Code available for payment source | Admitting: Gastroenterology

## 2021-10-25 ENCOUNTER — Encounter: Payer: Self-pay | Admitting: Gastroenterology

## 2021-10-25 VITALS — BP 111/76 | HR 102 | Temp 98.4°F | Ht 62.5 in | Wt 148.0 lb

## 2021-10-25 DIAGNOSIS — R195 Other fecal abnormalities: Secondary | ICD-10-CM

## 2021-10-25 DIAGNOSIS — R112 Nausea with vomiting, unspecified: Secondary | ICD-10-CM

## 2021-10-25 MED ORDER — ONDANSETRON HCL 4 MG PO TABS: 4.0000 mg | ORAL_TABLET | Freq: Three times a day (TID) | ORAL | 0 refills | Status: DC | PRN

## 2021-10-25 MED ORDER — PROMETHAZINE HCL 12.5 MG PO TABS: 12.5000 mg | ORAL_TABLET | Freq: Four times a day (QID) | ORAL | 0 refills | Status: AC | PRN

## 2021-10-25 NOTE — Progress Notes (Signed)
[facility]omiti ?  ?Arlyss Repress, MD ?[address]  ?[address]  ?[place], [place] [address]  ?Main: [phone]  ?Fax: [phone] ? ? ? ?Gastroenterology Consultation ? ?Referring Provider:     [facility], [facility] ?Primary Care Physician:  [facility], [facility] ?Primary Gastroenterologist:  Dr. Maximino Greenland ?Reason for Consultation:     Nausea, vomiting, abdominal pain, loose stools ?      ? HPI:   ?Emma Silva is a 21 y.o. female referred by Center, [facility]  for consultation & management of chronic history of intermittent episodes of nausea, vomiting, abdominal cramps, loose stools and occasional blood in the stools.  Patient states that the symptoms have been ongoing for 5 years.  Patient has gained more than 10 pounds since February 2022 after she returned from trip to [place].  Patient went to ER in last week of April secondary to another episode of severe nausea, vomiting and upper abdominal pain that started early in the morning associated with loose stools. ? ?Patient was evaluated by Dr. Maximino Greenland in 2022, work-up including fecal calprotectin levels, serum TTG IgA, H. pylori breath test came back negative.  Her initial fecal calprotectin levels were mildly elevated to 138 in 05/2020.  EGD and colonoscopy with biopsies were unremarkable.  Patient is currently being treated symptomatically with Zofran as needed as well as dicyclomine.  Her weight has been stable.  Patient reports that she goes through episodes of lack of appetite for several days followed by eating large amounts.  She also reports early morning episodes of nausea and vomiting with regurgitation of undigested food from night before.  Patient reports that her main symptom is nausea and vomiting.  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 ?Patient denies any marijuana use, denies tobacco or alcohol use ? ?NSAIDs: None ? ?Antiplts/Anticoagulants/Anti thrombotics: None ? ?GI Procedures: ?EGD  and colonoscopy 07/01/2020  ? ?DIAGNOSIS:  ?A. DUODENUM; COLD BIOPSY:  ?- ENTERIC MUCOSA WITH PRESERVED VILLOUS ARCHITECTURE AND NO SIGNIFICANT  ?HISTOPATHOLOGIC CHANGE.  ?- NEGATIVE FOR FEATURES OF CELIAC, DYSPLASIA, AND MALIGNANCY.  ? ?B. 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  ?- GASTRIC OXYNTIC MUCOSA WITH NO SIGNIFICANT HISTOPATHOLOGIC CHANGE.  ?- NEGATIVE FOR H. PYLORI, DYSPLASIA, AND MALIGNANCY.  ? ?C. COLON, RANDOM; COLD BIOPSY:  ?- BENIGN COLONIC MUCOSA WITH NO SIGNIFICANT HISTOPATHOLOGIC CHANGE.  ?- NEGATIVE FOR FEATURES OF MICROSCOPIC COLITIS AND ARCHITECTURAL CHANGES  ?OF CHRONIC COLITIS.  ?- NEGATIVE FOR DYSPLASIA AND MALIGNANCY.  ? ?Past Medical History:  ?Diagnosis Date  ? GERD (gastroesophageal reflux disease)   ? ? ?Past Surgical History:  ?Procedure Laterality Date  ? COLONOSCOPY WITH PROPOFOL N/A 07/01/2020  ? Procedure: COLONOSCOPY WITH PROPOFOL;  Surgeon: Pasty Spillers, MD;  Location: [facility] ENDOSCOPY;  Service: Endoscopy;  Laterality: N/A;  ? ESOPHAGOGASTRODUODENOSCOPY N/A 07/01/2020  ? Procedure: ESOPHAGOGASTRODUODENOSCOPY (EGD);  Surgeon: Pasty Spillers, MD;  Location: [facility] ENDOSCOPY;  Service: Endoscopy;  Laterality: N/A;  ? KNEE ARTHROSCOPY    ? ? ?Current Outpatient Medications:  ?  dicyclomine (BENTYL) 10 MG capsule, TAKE 1 CAPSULE (10 MG TOTAL) BY MOUTH 2 (TWO) TIMES DAILY AS NEEDED FOR UP TO 30 DOSES FOR SPASMS., Disp: 90 capsule, Rfl: 1 ?  levonorgestrel-ethinyl estradiol (ALESSE) 0.1-20 MG-MCG tablet, Take 1 tablet by mouth daily., Disp: , Rfl:  ?  ondansetron (ZOFRAN) 4 MG tablet, Take 1 tablet (4 mg total) by mouth every 8 (eight) hours as needed for nausea  or vomiting., Disp: 30 tablet, Rfl: 0 ?  promethazine (PHENERGAN) 12.5 MG tablet, Take 1 tablet (12.5 mg total) by mouth every 6 (six) hours as needed for nausea or vomiting., Disp: 30 tablet, Rfl: 0 ? ? ?No family history on file.  ? ?Social History  ? ?Tobacco Use  ? Smoking  status: Never  ? Smokeless tobacco: Never  ?Vaping Use  ? Vaping Use: Never used  ?Substance Use Topics  ? Alcohol use: Yes  ?  Comment: socially  ? Drug use: Never  ? ? ?Allergies as of 10/25/2021  ? (No Known Allergies)  ? ? ?Review of Systems:    ?All systems reviewed and negative except where noted in HPI. ? ? Physical Exam:  ?BP 111/76 (BP Location: Left Arm, Patient Position: Sitting, Cuff Size: Normal)   Pulse (!) 102   Temp 98.4 ?F (36.9 ?C) (Oral)   Ht 5' 2.5" (1.588 m)   Wt 148 lb (67.1 kg)   BMI 26.64 kg/m?  ?No LMP recorded. (Menstrual status: Oral contraceptives). ? ?General:   Alert,  Well-developed, well-nourished, pleasant and cooperative in NAD ?Head:  Normocephalic and atraumatic. ?Eyes:  Sclera clear, no icterus.   Conjunctiva pink. ?Ears:  Normal auditory acuity. ?Nose:  No deformity, discharge, or lesions. ?Mouth:  No deformity or lesions,oropharynx pink & moist. ?Neck:  Supple; no masses or thyromegaly. ?Lungs:  Respirations even and unlabored.  Clear throughout to auscultation.   No wheezes, crackles, or rhonchi. No acute distress. ?Heart:  Regular rate and rhythm; no murmurs, clicks, rubs, or gallops. ?Abdomen:  Normal bowel sounds. Soft, non-tender and non-distended without masses, hepatosplenomegaly or hernias noted.  No guarding or rebound tenderness.   ?Rectal: Not performed ?Msk:  Symmetrical without gross deformities. Good, equal movement & strength bilaterally. ?Pulses:  Normal pulses noted. ?Extremities:  No clubbing or edema.  No cyanosis. ?Neurologic:  Alert and oriented x3;  grossly normal neurologically. ?Skin:  Intact without significant lesions or rashes. No jaundice. ?Psych:  Alert and cooperative. Normal mood and affect. ? ?Imaging Studies: ?Reviewed ? ?Assessment and Plan:  ? ?Emma Silva is a 21 y.o. Caucasian female with history of anxiety, underwent therapy in the past, currently not on any treatment is seen in consultation for chronic symptoms of nausea, vomiting,  abdominal cramps and bloating ?EGD and colonoscopy are unremarkable including biopsies ?Recommend food allergy profile and alpha gal panel ?Trial of Zofran or Phenergan as needed ?Today, I have also discussed about gastric emptying study and HIDA scan, patient would like to defer these tests at this time ?For loose stools, recommend stool studies to rule out infection ?If her GI symptoms are related to anxiety, she will benefit from trial of antianxiety medication ?Advised patient to start exercise regularly, follow portion control and avoid binge eating ? ? ?Follow up in 3 to 4 months, virtual visit ? ? ?Arlyss Repress, MD ? ?

## 2021-11-05 LAB — ALPHA-GAL PANEL
Allergen Lamb IgE: <0.1 kU/L
Beef IgE: <0.1 kU/L
IgE (Immunoglobulin E), Serum: 66 IU/mL (ref 6–495)
O215-IgE Alpha-Gal: <0.1 kU/L
Pork IgE: <0.1 kU/L

## 2021-11-05 LAB — FOOD ALLERGY PROFILE
Allergen Corn, IgE: <0.1 kU/L
Clam IgE: <0.1 kU/L
Codfish IgE: <0.1 kU/L
Egg White IgE: <0.1 kU/L
Milk IgE: 0.11 kU/L — AB
Peanut IgE: 0.12 kU/L — AB
Scallop IgE: <0.1 kU/L
Sesame Seed IgE: <0.1 kU/L
Shrimp IgE: <0.1 kU/L
Soybean IgE: <0.1 kU/L
Walnut IgE: <0.1 kU/L
Wheat IgE: <0.1 kU/L

## 2022-01-25 ENCOUNTER — Telehealth (INDEPENDENT_AMBULATORY_CARE_PROVIDER_SITE_OTHER): Payer: No Typology Code available for payment source | Admitting: Gastroenterology

## 2022-01-25 ENCOUNTER — Encounter: Payer: Self-pay | Admitting: Gastroenterology

## 2022-01-25 ENCOUNTER — Telehealth: Payer: Self-pay

## 2022-01-25 DIAGNOSIS — R112 Nausea with vomiting, unspecified: Secondary | ICD-10-CM

## 2022-01-25 DIAGNOSIS — Z0189 Encounter for other specified special examinations: Secondary | ICD-10-CM

## 2022-01-25 NOTE — Telephone Encounter (Signed)
-----   Message from Toney Reil, MD sent at 01/25/2022  3:58 PM EDT ----- Regarding: Gastric emptying study Please schedule gastric emptying study after reaching out to her again  Dx: Intermittent nausea and vomiting, normal EGD  RV

## 2022-01-25 NOTE — Telephone Encounter (Signed)
Order Gastric emptying study and called central scheduling. Got patient schedule for 02/10/2022 [place] 8:[place]am [place] mall. Nothing to eat or drink after midnight.  No Dicyclomine, Zofran and Promethazine after might night Called and left a message for call back sent mychart message

## 2022-01-25 NOTE — Progress Notes (Signed)
Lannette Donath, MD [address]  [address]  [place], [place] [address]  Main: [phone]  Fax: [phone]    Gastroenterology Consultation Video Visit  Referring Provider:     [facility], [facility] Primary Care Physician:  Neita Goodnight Presley Raddle, MD Primary Gastroenterologist:  Dr. Arlyss Repress Reason for Consultation:  Nausea, vomiting        HPI:   Emma Silva is a 21 y.o. female referred by Dr. Neita Goodnight, Presley Raddle, MD  for consultation & management of chronic symptoms of intermittent nausea and vomiting  Virtual Visit Video Note  I connected with Page Spiro on 01/25/22 at  1:00 PM EDT by video and verified that I am speaking with the correct person using two identifiers.   I discussed the limitations, risks, security and privacy concerns of performing an evaluation and management service by video and the availability of in person appointments. I also discussed with the patient that there may be a patient responsible charge related to this service. The patient expressed understanding and agreed to proceed.  Location of the Patient: Home  Location of the provider: Office  Persons participating in the visit: Patient and provider only   History of Present Illness:  Emma Silva is a 21 y.o. female referred by Center, [facility]  for consultation & management of chronic history of intermittent episodes of nausea, vomiting, abdominal cramps, loose stools and occasional blood in the stools.  Patient states that the symptoms have been ongoing for 5 years.  Patient has gained more than 10 pounds since February 2022 after she returned from trip to [place].  Patient went to ER in last week of April secondary to another episode of severe nausea, vomiting and upper abdominal pain that started early in the morning associated with loose stools.   Patient was evaluated by Dr. Maximino Greenland in 2022, work-up including fecal calprotectin levels, serum TTG IgA, H.  pylori breath test came back negative.  Her initial fecal calprotectin levels were mildly elevated to 138 in 05/2020.  EGD and colonoscopy with biopsies were unremarkable.  Patient is currently being treated symptomatically with Zofran as needed as well as dicyclomine.  Her weight has been stable.  Patient reports that she goes through episodes of lack of appetite for several days followed by eating large amounts.  She also reports early morning episodes of nausea and vomiting with regurgitation of undigested food from night before.  Patient reports that her main symptom is nausea and vomiting.  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  She underwent food allergy profile which showed mild intolerance to milk and peanuts.  Patient reports that she has intermittent episodes of nausea and vomiting when she is in the college.  She came home for summer, has been working all day and not able to eat 3 meals a day.  She sometimes wakes up with nausea.  She had a bag of chips, peanut butter and recess chocolate today.  She is taking Zofran or Phenergan as needed only.  She does not believe she has intolerance to milk and peanuts.  Her weight fluctuates but within the range.    NSAIDs: None   Antiplts/Anticoagulants/Anti thrombotics: None   GI Procedures: EGD and colonoscopy 07/01/2020    DIAGNOSIS:  A. DUODENUM; COLD BIOPSY:  -  ENTERIC MUCOSA WITH PRESERVED VILLOUS ARCHITECTURE AND NO SIGNIFICANT  HISTOPATHOLOGIC CHANGE.  - NEGATIVE FOR FEATURES OF CELIAC, DYSPLASIA, AND MALIGNANCY.   B. 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  - GASTRIC OXYNTIC MUCOSA WITH NO SIGNIFICANT HISTOPATHOLOGIC CHANGE.  - NEGATIVE FOR H. PYLORI, DYSPLASIA, AND MALIGNANCY.   C.  COLON, RANDOM; COLD BIOPSY:  - BENIGN COLONIC MUCOSA WITH NO SIGNIFICANT HISTOPATHOLOGIC CHANGE.  - NEGATIVE FOR FEATURES OF MICROSCOPIC COLITIS AND ARCHITECTURAL CHANGES  OF CHRONIC COLITIS.  - NEGATIVE FOR DYSPLASIA AND MALIGNANCY.     Past Medical History:  Diagnosis Date   GERD (gastroesophageal reflux disease)     Past Surgical History:  Procedure Laterality Date   COLONOSCOPY WITH PROPOFOL N/A 07/01/2020   Procedure: COLONOSCOPY WITH PROPOFOL;  Surgeon: Pasty Spillers, MD;  Location: [facility] ENDOSCOPY;  Service: Endoscopy;  Laterality: N/A;   ESOPHAGOGASTRODUODENOSCOPY N/A 07/01/2020   Procedure: ESOPHAGOGASTRODUODENOSCOPY (EGD);  Surgeon: Pasty Spillers, MD;  Location: [facility] ENDOSCOPY;  Service: Endoscopy;  Laterality: N/A;   KNEE ARTHROSCOPY       Current Outpatient Medications:    dicyclomine (BENTYL) 10 MG capsule, TAKE 1 CAPSULE (10 MG TOTAL) BY MOUTH 2 (TWO) TIMES DAILY AS NEEDED FOR UP TO 30 DOSES FOR SPASMS., Disp: 90 capsule, Rfl: 1   levonorgestrel-ethinyl estradiol (ALESSE) 0.1-20 MG-MCG tablet, Take 1 tablet by mouth daily., Disp: , Rfl:    ondansetron (ZOFRAN) 4 MG tablet, Take 1 tablet (4 mg total) by mouth every 8 (eight) hours as needed for nausea or vomiting., Disp: 30 tablet, Rfl: 0   promethazine (PHENERGAN) 12.5 MG tablet, Take 1 tablet (12.5 mg total) by mouth every 6 (six) hours as needed for nausea or vomiting., Disp: 30 tablet, Rfl: 0   No family history on file.   Social History   Tobacco Use   Smoking status: Never   Smokeless tobacco: Never  Vaping Use   Vaping Use: Never used  Substance Use Topics   Alcohol use: Yes    Comment: socially   Drug use: Never    Allergies as of 01/25/2022   (No Known Allergies)    Imaging Studies: Reviewed  Assessment and Plan:   Ameriah Lint is a 21 y.o. female with history of anxiety, underwent therapy in the past, currently not on any treatment is seen in consultation for chronic symptoms of  nausea, vomiting, abdominal cramps and bloating EGD and colonoscopy are unremarkable including biopsies food allergy profile revealed mild intolerance to milk and peanuts and alpha gal panel was normal Advised patient to try to eliminate dairy and peanuts strictly for 1 month, however she disagreed Trial of Zofran or Phenergan as needed Today, I have also discussed about gastric emptying study and she would like to undergo this test If this is negative, I have discussed that her GI symptoms could be functional and she might benefit from a trial of antianxiety medication Advised patient to start exercise regularly, follow portion control and avoid binge eating   Follow Up Instructions:   I discussed the assessment and treatment plan with the patient. The patient was provided an opportunity to ask questions and all were answered. The patient agreed with the plan and demonstrated an understanding of the instructions.   The patient was advised to call back or seek an in-person evaluation if the symptoms worsen or if the condition fails to improve as anticipated.  I provided 20 minutes of face-to-face time during this encounter.   Follow  up based on the gastric emptying study results   Arlyss Repress, MD

## 2022-01-26 IMAGING — MR MR KNEE*L* W/O CM
6 series · 40 of 40 positions shown · non-contrast
Comparison: None.

CLINICAL DATA: Anterior and lateral left patellar pain shooting
down the leg to the ankle.

EXAM:
MRI OF THE LEFT KNEE WITHOUT CONTRAST
TECHNIQUE: Multiplanar, multisequence MR imaging of the knee was performed. No
intravenous contrast was administered.

[Series 8: T2 fat-sat · axial · left · 4.0mm · 0.50mm/px · z∈[-46,+77]mm · 5 of 26 slices shown (1 of 3)]
[im 1/26]
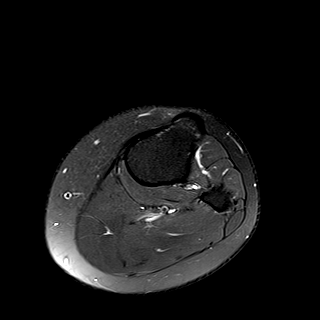
[im 7/26]
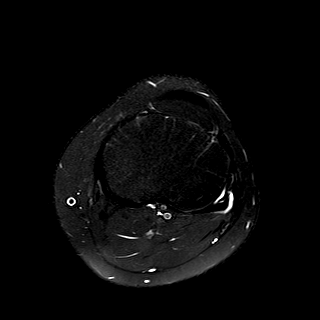
[im 13/26]
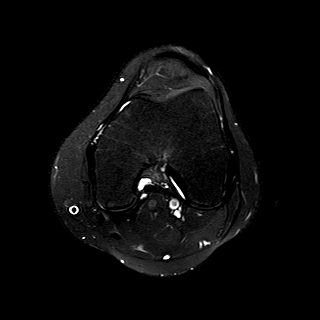
[im 19/26]
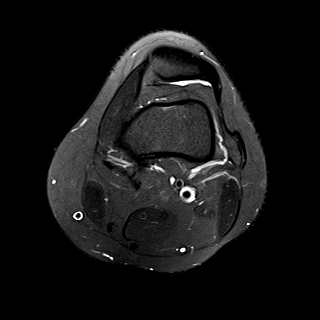
[im 26/26]
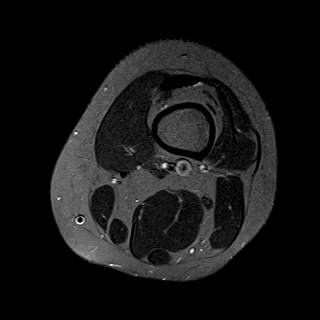

[Series 9: T1 · coronal · left · 4.0mm · 0.39mm/px · 7 of 32 slices shown]
[im 1/32]
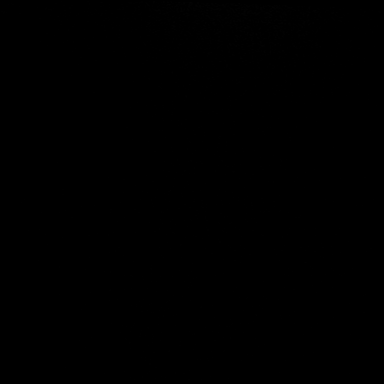
[im 6/32]
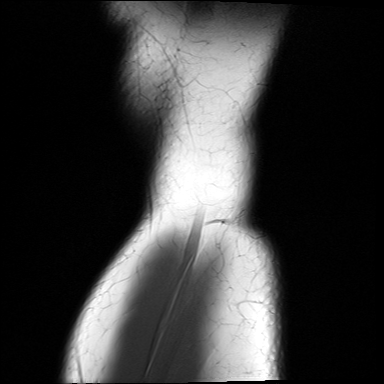
[im 11/32]
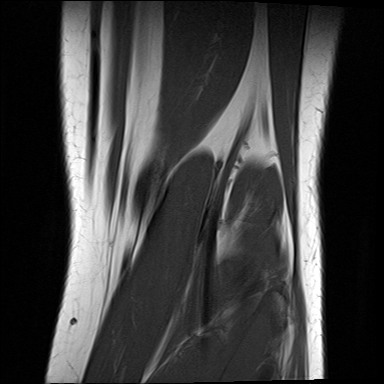
[im 16/32]
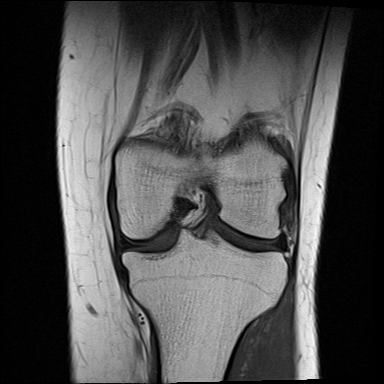
[im 21/32]
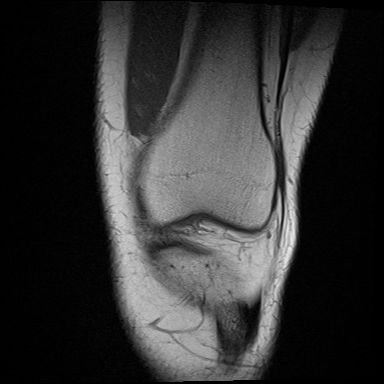
[im 26/32]
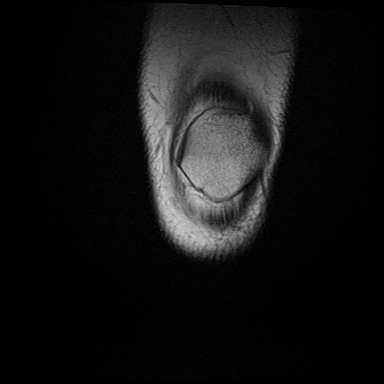
[im 32/32]
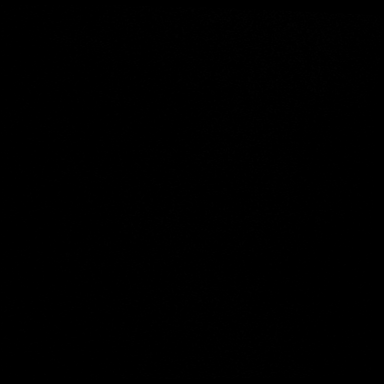

[Series 10: T2 fat-sat · coronal · left · 4.0mm · 0.59mm/px · 7 of 32 slices shown (2 of 3)]
[im 1/32]
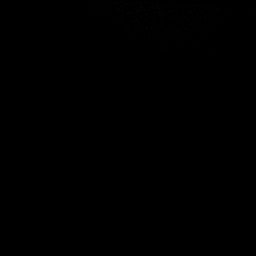
[im 6/32]
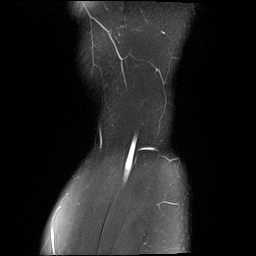
[im 11/32]
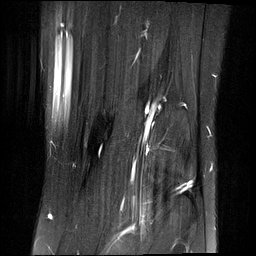
[im 16/32]
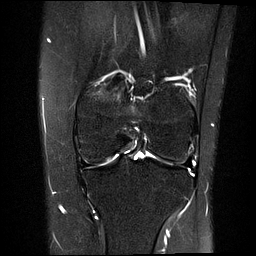
[im 21/32]
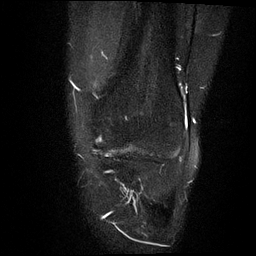
[im 26/32]
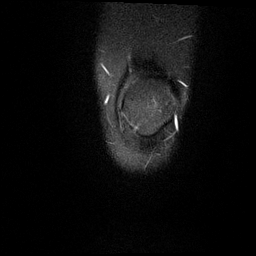
[im 32/32]
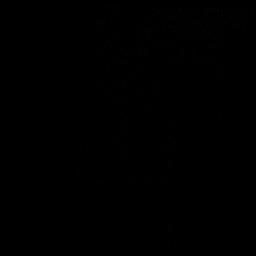

[Series 11: PD fat-sat · coronal · left · 4.0mm · 0.59mm/px · 7 of 32 slices shown (1 of 2)]
[im 1/32]
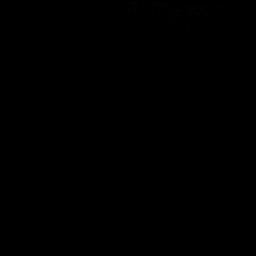
[im 6/32]
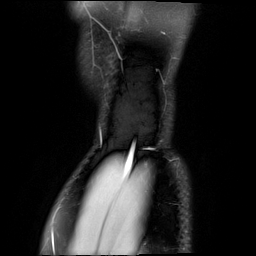
[im 11/32]
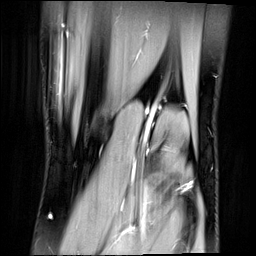
[im 16/32]
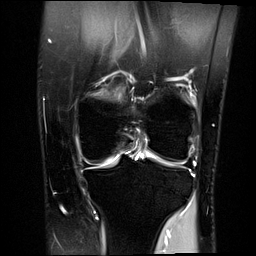
[im 21/32]
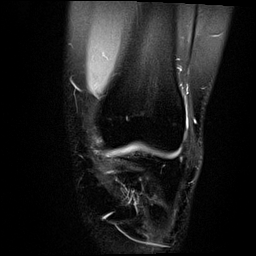
[im 26/32]
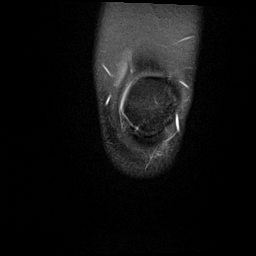
[im 32/32]
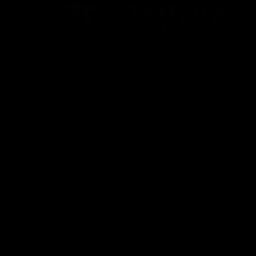

[Series 12: PD fat-sat · sagittal · left · 3.0mm · 0.59mm/px · 7 of 35 slices shown (2 of 2)]
[im 1/35]
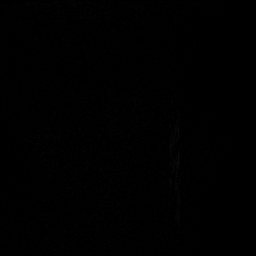
[im 6/35]
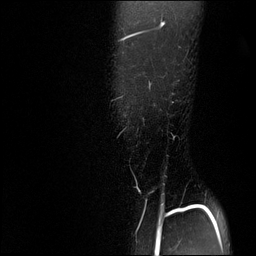
[im 12/35]
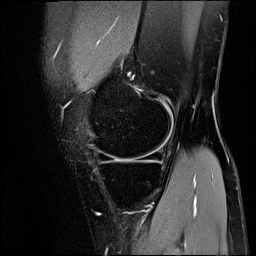
[im 18/35]
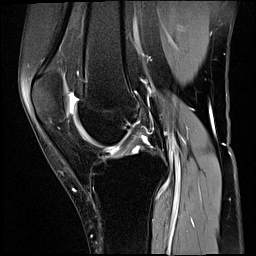
[im 23/35]
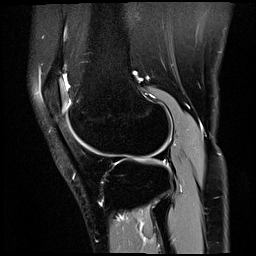
[im 29/35]
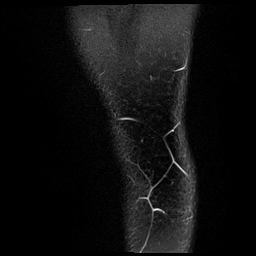
[im 35/35]
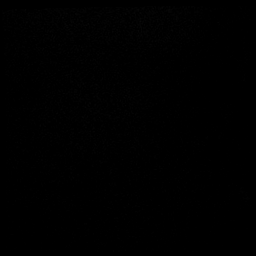

[Series 13: T2 fat-sat · sagittal · left · 3.0mm · 0.59mm/px · 7 of 36 slices shown (3 of 3)]
[im 1/36]
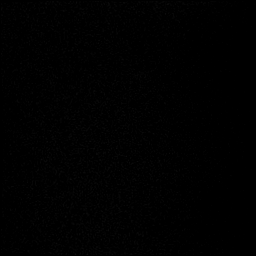
[im 6/36]
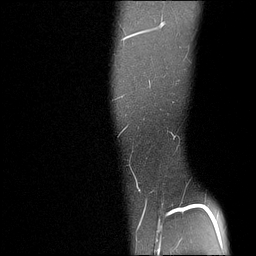
[im 12/36]
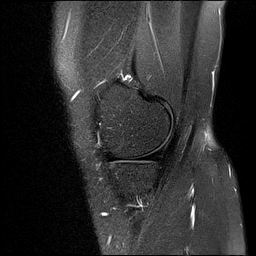
[im 18/36]
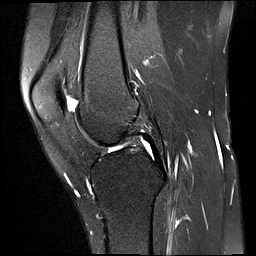
[im 24/36]
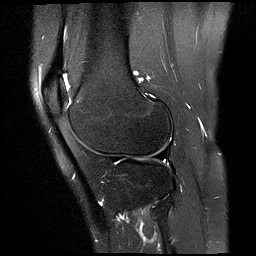
[im 30/36]
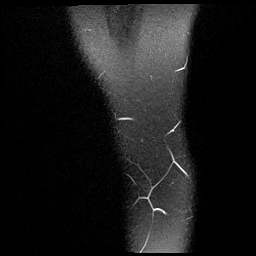
[im 36/36]
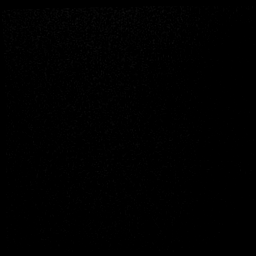

[40 of 40 positions shown; findings below may reference images not displayed]

FINDINGS: MENISCI

Medial meniscus:  Intact.

Lateral meniscus:  Intact.

LIGAMENTS

Cruciates:  Intact ACL and PCL.

Collaterals: Medial collateral ligament is intact. Lateral
collateral ligament complex is intact.

CARTILAGE

Patellofemoral:  No chondral defect.

Medial:  No chondral defect.

Lateral:  No chondral defect.

Joint:  No joint effusion. Normal Hoffa's fat. No plical thickening.

Popliteal Fossa:  No Baker cyst. Intact popliteus tendon.

Extensor Mechanism: Intact quadriceps tendon. Intact patellar
tendon. Intact medial patellar retinaculum. Intact lateral patellar
retinaculum. Intact MPFL.

Bones: No focal marrow signal abnormality. No fracture or
dislocation.

Other: No fluid collection or hematoma. Muscles are normal.
IMPRESSION: No internal derangement of the left knee.

## 2022-01-26 NOTE — Telephone Encounter (Addendum)
Called and left a message for call back. Left a detail message

## 2022-02-06 MED ORDER — ONDANSETRON HCL 4 MG PO TABS: 4.0000 mg | ORAL_TABLET | Freq: Three times a day (TID) | ORAL | 0 refills | Status: DC | PRN

## 2022-02-06 NOTE — Telephone Encounter (Signed)
Called patient and patient states that she took the Promethazine 4 different times and felt drug for 24 hours after the medication every time she took it. Informed patient that is a very common side effect. She asked if there is anything else she can take because the zofran helps some but not a lot. Talk to Dr. Servando Snare he said that zofran is the only other thing to help with Nausea and to get the gastric emptying as soon as she can. Called her back she will get it reschedule as soon as she returns. She also asked if she could get a refill on zofran. Informed patient yes we could refill it

## 2022-03-03 ENCOUNTER — Encounter
Admission: RE | Admit: 2022-03-03 | Discharge: 2022-03-03 | Disposition: A | Discharge status: Home / Self Care | Payer: No Typology Code available for payment source | Source: Ambulatory Visit | Attending: Gastroenterology | Admitting: Gastroenterology

## 2022-03-03 DIAGNOSIS — R112 Nausea with vomiting, unspecified: Secondary | ICD-10-CM | POA: Diagnosis present

## 2022-03-03 MED ORDER — TECHNETIUM TC 99M SULFUR COLLOID
2.3200 | Freq: Once | INTRAVENOUS | Status: AC
  Administered 2022-03-03: 2.32 via ORAL

## 2022-03-27 ENCOUNTER — Ambulatory Visit (INDEPENDENT_AMBULATORY_CARE_PROVIDER_SITE_OTHER): Payer: No Typology Code available for payment source | Admitting: Medical

## 2022-03-27 ENCOUNTER — Encounter: Payer: Self-pay | Admitting: Medical

## 2022-03-27 VITALS — BP 126/72 | HR 96 | Temp 98.1°F | Ht 63.0 in | Wt 145.0 lb

## 2022-03-27 DIAGNOSIS — S069X0A Unspecified intracranial injury without loss of consciousness, initial encounter: Secondary | ICD-10-CM | POA: Diagnosis not present

## 2022-03-27 NOTE — Progress Notes (Signed)
[facility] [address] [place], [place] [address] Phone: [phone] Fax: [phone]   Office Visit Note  Patient Name: Emma Silva  Date of Birth [birth year]  Med Record Number [id number]  Date of Service: 03/27/2022  Chief Complaint  Patient presents with   Concussion    Vital Signs: BP 126/72   Pulse 96   Temp 98.1 F (36.7 C) (Tympanic)   Ht 5\' 3"  (1.6 m)   Wt 145 lb (65.8 kg)   LMP 02/15/2022 (Exact Date) Comment: pt not currently pregnant or breastfeeding.  SpO2 99%   BMI 25.69 kg/m   Patient has no known allergies.  Current Medication:  Outpatient Encounter Medications as of 03/27/2022  Medication Sig   levonorgestrel-ethinyl estradiol (ALESSE) 0.1-20 MG-MCG tablet Take 1 tablet by mouth daily.   ondansetron (ZOFRAN) 4 MG tablet Take 1 tablet (4 mg total) by mouth every 8 (eight) hours as needed for nausea or vomiting.   dicyclomine (BENTYL) 10 MG capsule TAKE 1 CAPSULE (10 MG TOTAL) BY MOUTH 2 (TWO) TIMES DAILY AS NEEDED FOR UP TO 30 DOSES FOR SPASMS. (Patient not taking: Reported on 03/27/2022)   promethazine (PHENERGAN) 12.5 MG tablet Take 1 tablet (12.5 mg total) by mouth every 6 (six) hours as needed for nausea or vomiting. (Patient not taking: Reported on 03/27/2022)   No facility-administered encounter medications on file as of 03/27/2022.    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 After few seconds, felt lightheaded like she may pass out. Was able to sit down, did not lose consciousness. HA developed quickly after. Supervisor told her she looked zoned out, wasn't focused on what she was supposed to be doing. 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 Noted  sensitivity to light, drowsiness, fatigue and continued headache.  Hx of 2 prior concussions, each took about 6 weeks to resolve, most recent 2-3 years ago.   Took two Advil last night, did not help. No analgesic yet today. Current HA severity 6.5-7/10.   Has 3 classes. Media law, TV news and another journalism class. 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    Acute Concussion Evaluation (ACE)  Date/Time of Injury 03/26/22, 2:30 pm Reported by Patient  Injury Description Was hit in forehead with volleyball, unsure of velocity.  Is there evidence of a forcible blow to the head (direct or indirect)? Yes Had brief redness and slight swelling of forehead, resolved quickly Evidence of Intracranial injury or skill fracture? No Location of impact Frontal Causes Sports  Amnesia Before (Retrograde) Are there events just BEFORE the injury that you/person has no memory of (even brief)? No Amnesia After (Anterograde) Are there any events just AFTER the injury that you/person has no memory of (even brief)? No Loss of Consciousness No Early signs: Appears dazed or stunned Seizures: No  Headache: 1  Nausea: 1  Vomiting: 0  Balance Difficulty: 0  Dizziness: 1  Visual Problems:0  Fatigue: 1  Sensitivity to light: 1  Sensitivity to noise: 1  Numbness/Tingling: 0    Daytime Drowsiness: 1  Sleep Less Than Usual: 1  Sleep More Than Usual: 0  Trouble Falling  Asleep: 1    Irritability: 0  Sadness: 0  Feeling More Emotional: 0  Nervousness: 0    Feeling Mentally Foggy: 1  Feeling Slowed Down: 1  Difficulty Concentrating: 0  Difficulty Remembering: 0   Total Symptom Score: 11  Does Exertion/Physical Activity makes symptoms worse? No Does Cognitive Activity (reading, writing, studying) make symptoms worse?No Overall Rating: How different is the person acting compared to his/her usual self? 3  Risk  Factors for Protracted Recovery  History of Concussion? Yes How many Previous Concussions? 2 Longest symptom duration  6 weeks If multiple concussions, was less force needed to cause reinjury? Yes History of Headaches?No Prior treatment for headaches?No Personal History of Migraines?No Family History of MigrainesNo   History of Learning Disabilities?No History of ADD or ADHD?No Other Developmental disorders? No   History of Anxiety?Yes History of Depression?No Sleep Disorder?No Other Psychiatric DisorderNo  Experiences recurring episodes of nausea and vomiting, currently under evaluation by GI.  Review of Systems  Constitutional:  Positive for malaise/fatigue.  Eyes:  Positive for photophobia. Negative for blurred vision and double vision.  Gastrointestinal:  Positive for nausea. Negative for vomiting.  Neurological:  Positive for dizziness and headaches. Negative for tingling, speech change, focal weakness, seizures and loss of consciousness.  Psychiatric/Behavioral:         Sleep disturbance     Physical EXAM Physical Exam Vitals reviewed.  Constitutional:      General: She is not in acute distress.    Appearance: She is not ill-appearing.  HENT:     Head: Normocephalic and atraumatic.     Comments: No evidence of trauma to forehead, non tender.    Right Ear: Tympanic membrane, ear canal and external ear normal.     Left Ear: Tympanic membrane, ear canal and external ear normal.     Nose: No nasal deformity or signs of injury.     Mouth/Throat:     Mouth: Mucous membranes are moist. No oral lesions.     Tongue: Tongue does not deviate from midline.  Eyes:     General: Lids are normal.     Extraocular Movements: Extraocular movements intact.     Right eye: No nystagmus.     Left eye: No nystagmus.     Pupils: Pupils are equal, round, and reactive to light.  Musculoskeletal:     Cervical back: Full passive range of motion without pain and neck supple. No signs of  trauma or rigidity. No spinous process tenderness.     Comments: Moving all extremities. Strength 5/5 and symmetric in bilateral upper and lower extremities.   Neurological:     General: No focal deficit present.     Mental Status: She is alert and oriented to person, place, and time.     Cranial Nerves: Cranial nerves 2-12 are intact. No dysarthria or facial asymmetry.     Motor: Motor function is intact. No weakness or pronator drift.     Coordination: Coordination is intact. Romberg sign negative. Coordination normal. Finger-Nose-Finger Test normal. Rapid alternating movements normal.     Gait: Gait is intact. Tandem walk normal.  Psychiatric:        Attention and Perception: Attention normal.        Mood and Affect: Mood and affect normal.        Speech: Speech normal.        Behavior: Behavior normal. Behavior is cooperative.    Assessment/Plan: 1. Traumatic brain injury, without loss of consciousness, initial encounter (  HCC) Findings consistent with concussion/mild TBI. Exam reassuring. No indication for imaging at this time. Discussed [employer] policy regarding class absences. Sent form to patient through MyChart documenting concussion - may need absences 10/2 - 04/01/22, may need extensions on assignments and postponement of exams through 04/06/22. Patient encouraged to communicate directly with professors regarding absences. Recommended not attending class next few days to allow for rest. Follow up on 03/30/22 for re-evaluation or sooner if needed.   Discussed with patient the importance of physical and mental rest for recovery. Avoid looking at screens, physical activity (I.e. strenuous exercise, sports), bright light, loud noise and alcohol consumption. May take tylenol or ibuprofen for headache. Encouraged patient to drink plenty of water and eat regular meals/snacks. Recommended no extracurricular activities or work this week.     General Counseling: Kolette verbalizes understanding of  the findings of todays visit and agrees with plan of treatment. She has been encouraged to call the office with any questions or concerns that should arise related to todays visit.   No orders of the defined types were placed in this encounter.   No orders of the defined types were placed in this encounter.   Time spent: 35 Minutes    Jonathon Resides PA-C [employer]

## 2022-03-27 NOTE — Patient Instructions (Addendum)
Your symptoms and exam are consistent with a concussion.  I recommend not attending class the next 2-3 days.  Physical and mental rest are extremely important for recovery. Avoid looking at screens the next few days. No strenuous physical activity/exercise. Avoid bright light, loud noise and alcohol consumption. You may take tylenol or ibuprofen for  your headache. Take naps if needed, drink plenty of water and eat regular meals/snacks.    Go to to the emergency department with sudden onset of any of the following; Headaches that worsen, seizures, looks very drowsy/can't be awakened, repeated vomiting, slurred speech, can't recognize people or places, increasing confusion or irritability, weakness or numbness in arms/legs, worsening neck pain, unusual behavior change, or change in state of consciousness.     Send MyChart message to provider Billey Gosling PA-C) or schedule earlier appointment if needed with questions or concerns.  Instructions for Absence Notification Request ([place])  You will receive an "[facility] Illness Form" through your MyChart. You'll first need to download and save it to your computer. You can then upload it to the [facility] and Outreach website:   [email or web address]   Under "Extended Absences", click on the link for "Submit an Absence Notification Request". Once you log in with your [place] username and password, there should be instructions for uploading the document. Once you have submitted the document, [facility] and Outreach should send a notification to your professors.

## 2022-03-30 ENCOUNTER — Encounter: Payer: Self-pay | Admitting: Medical

## 2022-03-30 ENCOUNTER — Other Ambulatory Visit: Payer: Self-pay

## 2022-03-30 ENCOUNTER — Ambulatory Visit (INDEPENDENT_AMBULATORY_CARE_PROVIDER_SITE_OTHER): Payer: No Typology Code available for payment source | Admitting: Medical

## 2022-03-30 VITALS — BP 101/65 | HR 91 | Temp 99.1°F | Wt 145.0 lb

## 2022-03-30 DIAGNOSIS — S069X0D Unspecified intracranial injury without loss of consciousness, subsequent encounter: Secondary | ICD-10-CM | POA: Diagnosis not present

## 2022-03-30 NOTE — Progress Notes (Signed)
[facility] [address] [place], [place] [address] Phone: [phone] Fax: [phone]   Office Visit Note  Patient Name: Emma Silva  Date of Birth [birth year]  Med Record Number [id number]  Date of Service: 03/30/2022  Chief Complaint  Patient presents with   concussion follow up     Patient has no known allergies.  Current Medication:  Outpatient Encounter Medications as of 03/30/2022  Medication Sig   dicyclomine (BENTYL) 10 MG capsule TAKE 1 CAPSULE (10 MG TOTAL) BY MOUTH 2 (TWO) TIMES DAILY AS NEEDED FOR UP TO 30 DOSES FOR SPASMS. (Patient not taking: Reported on 03/27/2022)   levonorgestrel-ethinyl estradiol (ALESSE) 0.1-20 MG-MCG tablet Take 1 tablet by mouth daily.   ondansetron (ZOFRAN) 4 MG tablet Take 1 tablet (4 mg total) by mouth every 8 (eight) hours as needed for nausea or vomiting.   promethazine (PHENERGAN) 12.5 MG tablet Take 1 tablet (12.5 mg total) by mouth every 6 (six) hours as needed for nausea or vomiting. (Patient not taking: Reported on 03/27/2022)   No facility-administered encounter medications on file as of 03/30/2022.    Medical History: Past Medical History:  Diagnosis Date   GERD (gastroesophageal reflux disease)    History of multiple concussions    #3 - 03/26/22   See note from 03/27/22 for additional history.  Interval Hx: Still having HA, not as persistent and sometimes not as intense. Still taking Advil, two every 6-8 hours, not really helping.   Has been using laptop but for shorter periods of time. Has turned down brightness and using blue light glasses. States she is taking frequent breaks.  Has gone to [employer] class, getting notes from others. Not attending other classes. 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 Patient states supervisor is aware of her diagnosis and willing to accommodate as needed. Has not yet uploaded form documenting concussion to [facility] and [facility] site but knows how.  No new symptoms. Still having difficulty falling asleep, thinks it is due to headache and having little to distract her from it while falling asleep.  Acute Concussion Evaluation (ACE) Previous score was 11 on 03/27/22.  Date/Time of Injury 03/26/22, 2:30 pm Reported by Patient   Injury Description Was hit in forehead with volleyball, unsure of velocity.   Is there evidence of a forcible blow to the head (direct or indirect)? Yes Had brief redness and slight swelling of forehead, resolved quickly Evidence of Intracranial injury or skill fracture? No Location of impact Frontal Causes Sports   Amnesia Before (Retrograde) Are there events just BEFORE the injury that you/person has no memory of (even brief)? No Amnesia After (Anterograde) Are there any events just AFTER the injury that you/person has no memory of (even brief)? No Loss of Consciousness No Early signs: Appears dazed or stunned Seizures: No  Headache: 1  Nausea: 0  Vomiting: 0  Balance Difficulty: 0  Dizziness: 0  Visual Problems:0  Fatigue: 1  Sensitivity to light: 1  Sensitivity to noise: 1  Numbness/Tingling: 0    Daytime Drowsiness: 0  Sleep Less Than Usual: 1  Sleep More Than Usual: 0  Trouble Falling Asleep: 1    Irritability: 0  Sadness: 0  Feeling More Emotional: 0  Nervousness: 0    Feeling Mentally Foggy: 1  Feeling Slowed Down: 0  Difficulty Concentrating: 0  Difficulty Remembering: 0   Total Symptom Score: 7  Does Exertion/Physical  Activity makes symptoms worse? No Does Cognitive Activity (reading, writing, studying) make symptoms worse?Yes Overall Rating: How different is the person acting compared to his/her usual self? 3  Risk Factors for Protracted Recovery  History of Concussion? Yes How many Previous Concussions? 2 Longest symptom duration  6 weeks If multiple concussions, was less force needed to cause reinjury? Yes History of  Headaches?No Prior treatment for headaches?No Personal History of Migraines?No Family History of MigrainesNo    History of Learning Disabilities?No History of ADD or ADHD?No Other Developmental disorders? No    History of Anxiety?Yes History of Depression?No Sleep Disorder?No Other Psychiatric DisorderNo   Vital Signs: BP 101/65   Pulse 91   Temp 99.1 F (37.3 C)   Wt 145 lb (65.8 kg)   LMP 02/15/2022 (Exact Date) Comment: pt not currently pregnant or breastfeeding.  SpO2 98%   BMI 25.69 kg/m   Physical EXAM Physical Exam Vitals reviewed.  Constitutional:      General: She is not in acute distress.    Appearance: She is not ill-appearing.  HENT:     Head: Normocephalic.  Eyes:     Extraocular Movements: Extraocular movements intact.     Pupils: Pupils are equal, round, and reactive to light.  Neurological:     Mental Status: She is alert and oriented to person, place, and time.     Cranial Nerves: Cranial nerves 2-12 are intact. No dysarthria or facial asymmetry.     Motor: No pronator drift.     Coordination: Romberg sign negative. Coordination normal. Finger-Nose-Finger Test normal.     Gait: Gait is intact. Tandem walk normal.     Comments: Stable single leg stance bilaterally.    Assessment/Plan: 1. Traumatic brain injury, without loss of consciousness, subsequent encounter Condition stable or mildly improved. ACE score decreased from 11 at prior visit to 7 today. Reviewed importance of rest and limiting activities (i.e. looking at screens, reading, studying) for recovery.  Patient encouraged to upload form documenting injury to [facility] and Outreach so the information can be shared with her professors. Offered to provide additional documentation for employer - patient declined, states they have been accommodating. Patient encouraged to ask for additional time on assignments or to delay exams to allow for more rest. Also encouraged to take breaks when  attempting to work (i.e. getting away from noise/bright light as often as possible).  Consider trying Excedrin Migraine for headaches.  Discussed trial of Benadryl at bedtime to help with sleep. Also discussed reading, listening to relaxing music or white noise before bed.  No strenuous exercise/physical activity/sports until symptoms resolve.  Patient will send MyChart message or schedule follow up visit as needed for new/worsening symptoms or if symptoms not gradually improving over next 7-10 days.   General Counseling: Stephan verbalizes understanding of the findings of todays visit and agrees with plan of treatment. She has been encouraged to call the office with any questions or concerns that should arise related to todays visit.   No orders of the defined types were placed in this encounter.   No orders of the defined types were placed in this encounter.   Time spent:30 [place] PA-C [employer]

## 2022-04-02 ENCOUNTER — Encounter: Payer: Self-pay | Admitting: Medical

## 2022-04-02 NOTE — Patient Instructions (Signed)
-  Physical and mental rest are extremely important for recovery from your concussion. Continue to avoid screens when possible.   Upload the form provided at the previous visit documenting your injury to [facility] and Outreach so the information can be shared with your professors. Let me know if you need additional documentation for your employer. Ask for additional time on assignments or to delay exams to allow for more rest. Take frequent breaks when attempting to work (i.e. getting away from noise/bright light as often as possible).  Consider trying Excedrin Migraine for headaches.  Consider trying Benadryl at bedtime to help with sleep. You can also try reading, listening to relaxing music or white noise before bed.  No strenuous exercise/physical activity/sports until your symptoms resolve. Do not drink alcohol until your symptoms resolve.  Send MyChart message or schedule follow up visit as needed for new/worsening symptoms or if symptoms not gradually improving over next 7-10 days.

## 2022-05-11 ENCOUNTER — Other Ambulatory Visit: Payer: Self-pay | Admitting: Gastroenterology

## 2022-05-11 DIAGNOSIS — R112 Nausea with vomiting, unspecified: Secondary | ICD-10-CM

## 2022-05-11 MED ORDER — ONDANSETRON HCL 4 MG PO TABS: 4.0000 mg | ORAL_TABLET | Freq: Three times a day (TID) | ORAL | 0 refills | Status: DC | PRN

## 2022-09-28 ENCOUNTER — Ambulatory Visit (INDEPENDENT_AMBULATORY_CARE_PROVIDER_SITE_OTHER): Payer: No Typology Code available for payment source | Admitting: Medical

## 2022-09-28 ENCOUNTER — Other Ambulatory Visit: Payer: Self-pay

## 2022-09-28 ENCOUNTER — Encounter: Payer: Self-pay | Admitting: Medical

## 2022-09-28 VITALS — BP 101/68 | HR 96 | Temp 98.0°F | Wt 145.0 lb

## 2022-09-28 DIAGNOSIS — M25562 Pain in left knee: Secondary | ICD-10-CM

## 2022-09-28 NOTE — Progress Notes (Signed)
[facility] [address] [place], [place] [address] Phone: [phone] Fax: [phone]   Office Visit Note  Patient Name: Emma Silva  Date of Birth:[birth year]  Med Rec number [id number]  Date of Service: 09/28/2022  Allergies: Patient has no known allergies.  Chief Complaint  Patient presents with   Knee Pain     HPI 22 y.o. college student presents with left knee pain.  Noted pain to left knee yesterday AM when she woke up. Had been at gym day before, did knee curls/extensions, leg press and walking. Did not have pain during or immediately after workout. Pain is around knee cap/anterior. Did appear swollen yesterday when she woke up. Has been working out more often recently as she has had more time available. Mainly walking and using weight machines. Denies any direct trauma or falls. Has been painful to extend and flex at left knee. Walking some painful. Iced yesterday and today for 20 minutes, trying to rest. Took 400 mg Ibuprofen, did not help much. Going home to [place] after graduation next month.   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 Was told she has same condition to left knee but was told surgery not required. She states she experiences "flares" related to plica in left knee from time to time but that current pain feels different.    Current Medication:  Outpatient Encounter Medications as of 09/28/2022  Medication Sig   albuterol (VENTOLIN HFA) 108 (90 Base) MCG/ACT inhaler Inhale into the lungs every 6 (six) hours as needed for wheezing or shortness of breath.   dicyclomine (BENTYL) 10 MG capsule TAKE 1 CAPSULE (10 MG TOTAL) BY MOUTH 2 (TWO) TIMES DAILY AS NEEDED FOR UP TO 30 DOSES FOR SPASMS.   levonorgestrel-ethinyl estradiol (ALESSE) 0.1-20 MG-MCG tablet Take 1 tablet by mouth daily.   ondansetron (ZOFRAN) 4 MG tablet Take 1 tablet (4 mg total) by mouth every 8 (eight) hours as needed for nausea or vomiting.   promethazine (PHENERGAN) 12.5 MG  tablet Take 1 tablet (12.5 mg total) by mouth every 6 (six) hours as needed for nausea or vomiting. (Patient taking differently: Take 12.5 mg by mouth every 6 (six) hours as needed for nausea or vomiting. Only in emergency)   No facility-administered encounter medications on file as of 09/28/2022.      Medical History: Past Medical History:  Diagnosis Date   GERD (gastroesophageal reflux disease)    History of multiple concussions    #3 - 03/26/22   Past Surgical History:  Procedure Laterality Date   COLONOSCOPY WITH PROPOFOL N/A 07/01/2020   Procedure: COLONOSCOPY WITH PROPOFOL;  Surgeon: Pasty Spillers, MD;  Location: [facility] ENDOSCOPY;  Service: Endoscopy;  Laterality: N/A;   ESOPHAGOGASTRODUODENOSCOPY N/A 07/01/2020   Procedure: ESOPHAGOGASTRODUODENOSCOPY (EGD);  Surgeon: Pasty Spillers, MD;  Location: [facility] ENDOSCOPY;  Service: Endoscopy;  Laterality: N/A;   KNEE ARTHROSCOPY       Vital Signs: BP 101/68   Pulse 96   Temp 98 F (36.7 C) (Tympanic)   Wt 145 lb (65.8 kg)   SpO2 98%   BMI 25.69 kg/m    Review of Systems  Constitutional:  Positive for activity change.  Musculoskeletal:  Positive for arthralgias (left knee), gait problem and joint swelling (left knee).    Physical Exam Vitals reviewed.  Constitutional:      General: She is not in acute distress.    Appearance: She is not ill-appearing.  Musculoskeletal:     Left knee:  No swelling, deformity, effusion, erythema, ecchymosis or bony tenderness. Decreased range of motion (Active flexion/extension limited moderately by pain. Full passive extension of left knee, though painful.). Tenderness (most tender medial/lateral to patella, above joint line) present over the medial joint line and patellar tendon (slightly). No lateral joint line tenderness. Normal alignment and normal patellar mobility.     Instability Tests: Anterior drawer test negative. Anterior Lachman test negative.     Comments: Gait slightly  slow, no limp.  Neurological:     Mental Status: She is alert.     Assessment/Plan: 1. Acute pain of left knee Differential diagnosis includes medial plica syndrome, meniscus injury or MCL sprain.  Discussed conservative treatment measures. Wrapped knee in clinic with compressive bandage. Recommended two OTC Aleve every 12 hours next several days. Xray unlikely to be helpful given, do not suspect fracture/dislocation.  Discussed option to see [facility] urgent care if not improving over next 5-7 days. Would likely need MRI to see majority of structures likely to be involved.  Patient has crutches here at school she can use if needed.  Send MyChart message to provider with questions or concerns.     General Counseling: Sebella verbalizes understanding of the findings of todays visit and agrees with plan of treatment. she has been encouraged to call the office with any questions or concerns that should arise related to todays visit.    Time spent:20 Minutes    Jonathon Resides PA-C [employer] [facility] 09/28/2022 3:32 PM

## 2022-09-30 ENCOUNTER — Other Ambulatory Visit: Payer: Self-pay | Admitting: Gastroenterology

## 2022-09-30 DIAGNOSIS — R112 Nausea with vomiting, unspecified: Secondary | ICD-10-CM

## 2022-10-02 MED ORDER — ONDANSETRON HCL 4 MG PO TABS
4.0000 mg | ORAL_TABLET | Freq: Three times a day (TID) | ORAL | 0 refills | Status: DC | PRN
Start: 2022-10-02 — End: 2022-11-26

## 2022-10-02 NOTE — Telephone Encounter (Signed)
Last office visit 01/25/2022 nausea and vomiting  Last refill 05/11/2022 0 refills  No appointment is schedule

## 2022-10-05 NOTE — Patient Instructions (Addendum)
-  Continue wrapping knee for light compression over next few days. -Apply ice/cold compress wrapped in towel for 20 minutes every few hours next 24-48 hours.  -Keep knee elevated when able and rest/stay off leg when possible until pain improves. -Take two over-the-counter  Aleve/Naproxen every 12 hours with food for next 3-5 days.  -Consider visiting [facility] urgent care in [place] if pain and range of motion are not improving over the next 5-7 days. -Send MyChart message to provider with questions or concerns.

## 2022-11-26 ENCOUNTER — Encounter: Payer: Self-pay | Admitting: Gastroenterology

## 2022-11-26 ENCOUNTER — Other Ambulatory Visit: Payer: Self-pay | Admitting: Gastroenterology

## 2022-11-26 DIAGNOSIS — R112 Nausea with vomiting, unspecified: Secondary | ICD-10-CM

## 2022-11-27 MED ORDER — ONDANSETRON HCL 4 MG PO TABS
4.0000 mg | ORAL_TABLET | Freq: Three times a day (TID) | ORAL | 0 refills | Status: DC | PRN
Start: 2022-11-27 — End: 2023-05-28

## 2022-11-27 MED ORDER — DICYCLOMINE HCL 10 MG PO CAPS: 10.0000 mg | ORAL_CAPSULE | Freq: Two times a day (BID) | ORAL | 1 refills | Status: DC | PRN

## 2022-11-27 NOTE — Telephone Encounter (Signed)
Last office visit 01/25/2022 Nausea and vomiting  Last refill 10/02/2022 0 refills

## 2022-11-27 NOTE — Telephone Encounter (Signed)
Last office visit 01/25/2022 Nausea and vomiting  Last refill 03/28/2021 1 refill

## 2022-12-06 ENCOUNTER — Other Ambulatory Visit: Payer: Self-pay | Admitting: Gastroenterology

## 2023-03-16 ENCOUNTER — Other Ambulatory Visit: Payer: Self-pay | Admitting: Gastroenterology

## 2023-05-28 ENCOUNTER — Other Ambulatory Visit: Payer: Self-pay | Admitting: Gastroenterology

## 2023-05-28 DIAGNOSIS — R112 Nausea with vomiting, unspecified: Secondary | ICD-10-CM

## 2023-05-28 MED ORDER — ONDANSETRON HCL 4 MG PO TABS
4.0000 mg | ORAL_TABLET | Freq: Three times a day (TID) | ORAL | 0 refills | Status: DC | PRN
Start: 2023-05-28 — End: 2023-08-02

## 2023-08-02 ENCOUNTER — Encounter: Payer: Self-pay | Admitting: Gastroenterology

## 2023-08-02 ENCOUNTER — Telehealth: Payer: No Typology Code available for payment source | Admitting: Gastroenterology

## 2023-08-02 DIAGNOSIS — R110 Nausea: Secondary | ICD-10-CM

## 2023-08-02 MED ORDER — ONDANSETRON HCL 4 MG PO TABS: 4.0000 mg | ORAL_TABLET | Freq: Three times a day (TID) | ORAL | 2 refills | Status: AC | PRN

## 2023-08-02 NOTE — Progress Notes (Signed)
 Corinn Brooklyn, MD [address]  [address]  [place], [place] [address]  Main: [phone]  Fax: [phone]    Gastroenterology Consultation Video Visit  Referring Provider:     No ref. provider found Primary Care Physician:  Pcp, No Primary Gastroenterologist:  Dr. Corinn JONELLE Brooklyn Reason for Consultation:  Nausea, abdominal cramps        HPI:   Emma Silva is a 23 y.o. female referred by self for consultation & management of chronic symptoms of intermittent nausea and abdominal cramps  Virtual Visit Video Note  I connected with Emma Silva on 08/02/23 at  2:30 PM EST by video and verified that I am speaking with the correct person using two identifiers.   I discussed the limitations, risks, security and privacy concerns of performing an evaluation and management service by video and the availability of in person appointments. I also discussed with the patient that there may be a patient responsible charge related to this service. The patient expressed understanding and agreed to proceed.  Location of the Patient: Home  Location of the provider: Office  Persons participating in the visit: Patient and provider only   History of Present Illness:  Emma Silva is a 23 y.o. female referred by [facility]  for consultation & management of chronic history of intermittent episodes of nausea, vomiting, abdominal cramps, loose stools and occasional blood in the stools.  Patient states that the symptoms have been ongoing for 5 years.  Patient has gained more than 10 pounds since February 2022 after she returned from trip to [place].  Patient went to ER in last week of April secondary to another episode of severe nausea, vomiting and upper abdominal pain that started early in the morning associated with loose stools.   Patient was evaluated by Dr. Janalyn in 2022, work-up including fecal calprotectin levels, serum TTG IgA, H. pylori breath test came back  negative.  Her initial fecal calprotectin levels were mildly elevated to 138 in 05/2020.  EGD and colonoscopy with biopsies were unremarkable.  Patient is currently being treated symptomatically with Zofran  as needed as well as dicyclomine .  Her weight has been stable.  Patient reports that she goes through episodes of lack of appetite for several days followed by eating large amounts.  She also reports early morning episodes of nausea and vomiting with regurgitation of undigested food from night before.  Patient reports that her main symptom is nausea and vomiting.  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  Patient reports that she has intermittent episodes of nausea and vomiting when she is in the college.  She came home for summer, has been working all day and not able to eat 3 meals a day.  She sometimes wakes up with nausea.  She had a bag of chips, peanut butter and recess chocolate today.  She is taking Zofran  or Phenergan  as needed only.  She does not believe she has intolerance to milk and peanuts.  Her weight fluctuates but within the range.  Follow-up visit 08/02/2023 Emma Silva sees me for symptoms of intermittent nausea and abdominal cramps.  She reports that her symptoms have gotten less frequent and needing dicyclomine  and Zofran  intermittently about  2-3 times a week only.  She made a follow-up visit in order to refill her medication.  Her weight has been stable, denies any other GI symptoms.    NSAIDs: None   Antiplts/Anticoagulants/Anti thrombotics: None   GI Procedures: EGD and colonoscopy 07/01/2020    DIAGNOSIS:  A. DUODENUM; COLD BIOPSY:  - ENTERIC MUCOSA WITH PRESERVED VILLOUS ARCHITECTURE AND  NO SIGNIFICANT  HISTOPATHOLOGIC CHANGE.  - NEGATIVE FOR FEATURES OF CELIAC, DYSPLASIA, AND MALIGNANCY.   B. 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  - GASTRIC OXYNTIC MUCOSA WITH NO SIGNIFICANT HISTOPATHOLOGIC CHANGE.  - NEGATIVE FOR H. PYLORI, DYSPLASIA, AND MALIGNANCY.   C. COLON, RANDOM; COLD BIOPSY:  - BENIGN COLONIC MUCOSA WITH NO SIGNIFICANT HISTOPATHOLOGIC CHANGE.  - NEGATIVE FOR FEATURES OF MICROSCOPIC COLITIS AND ARCHITECTURAL CHANGES  OF CHRONIC COLITIS.  - NEGATIVE FOR DYSPLASIA AND MALIGNANCY.     Past Medical History:  Diagnosis Date   GERD (gastroesophageal reflux disease)    History of multiple concussions    #3 - 03/26/22    Past Surgical History:  Procedure Laterality Date   COLONOSCOPY WITH PROPOFOL  N/A 07/01/2020   Procedure: COLONOSCOPY WITH PROPOFOL ;  Surgeon: Janalyn Keene NOVAK, MD;  Location: [facility] ENDOSCOPY;  Service: Endoscopy;  Laterality: N/A;   ESOPHAGOGASTRODUODENOSCOPY N/A 07/01/2020   Procedure: ESOPHAGOGASTRODUODENOSCOPY (EGD);  Surgeon: Janalyn Keene NOVAK, MD;  Location: [facility] ENDOSCOPY;  Service: Endoscopy;  Laterality: N/A;   KNEE ARTHROSCOPY       Current Outpatient Medications:    albuterol (VENTOLIN HFA) 108 (90 Base) MCG/ACT inhaler, Inhale into the lungs every 6 (six) hours as needed for wheezing or shortness of breath., Disp: , Rfl:    dicyclomine  (BENTYL ) 10 MG capsule, TAKE 1 CAPSULE (10 MG TOTAL) BY MOUTH 2 (TWO) TIMES DAILY AS NEEDED FOR SPASMS., Disp: 180 capsule, Rfl: 0   levonorgestrel-ethinyl estradiol (ALESSE) 0.1-20 MG-MCG tablet, Take 1 tablet by mouth daily., Disp: , Rfl:    ondansetron  (ZOFRAN ) 4 MG tablet, Take 1 tablet (4 mg total) by mouth every 8 (eight) hours as needed for nausea or vomiting., Disp: 30 tablet, Rfl: 2   promethazine  (PHENERGAN ) 12.5 MG tablet, Take 1 tablet (12.5 mg total) by mouth every 6 (six) hours as needed for nausea or vomiting. (Patient taking differently: Take  12.5 mg by mouth every 6 (six) hours as needed for nausea or vomiting. Only in emergency), Disp: 30 tablet, Rfl: 0   No family history on file.   Social History   Tobacco Use   Smoking status: Never   Smokeless tobacco: Never  Vaping Use   Vaping status: Never Used  Substance Use Topics   Alcohol use: Yes    Comment: socially   Drug use: Never    Allergies as of 08/02/2023   (No Known Allergies)    Imaging Studies: Reviewed  Assessment and Plan:   Emma Silva is a 23 y.o. 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 The patient was provided an opportunity to ask questions and all were answered.  The patient agreed with the plan and demonstrated an understanding of the instructions.   The patient was advised to call back or seek an in-person evaluation if the symptoms worsen or if the condition fails to improve as anticipated.  I provided 20 minutes of face-to-face time during this encounter.   Follow up as needed   Corinn JONELLE Brooklyn, MD
